# Patient Record
Sex: Female | Born: 1955 | Race: White | Hispanic: No | Smoking: Never smoker
Health system: Southern US, Community
[De-identification: ages and names within clinical notes are randomized; demographics above are authoritative.]

## PROBLEM LIST (undated history)

## (undated) DIAGNOSIS — F32A Depression, unspecified: Secondary | ICD-10-CM

## (undated) DIAGNOSIS — F329 Major depressive disorder, single episode, unspecified: Secondary | ICD-10-CM

## (undated) DIAGNOSIS — E119 Type 2 diabetes mellitus without complications: Secondary | ICD-10-CM

## (undated) DIAGNOSIS — F419 Anxiety disorder, unspecified: Secondary | ICD-10-CM

## (undated) DIAGNOSIS — J45909 Unspecified asthma, uncomplicated: Secondary | ICD-10-CM

## (undated) DIAGNOSIS — I10 Essential (primary) hypertension: Secondary | ICD-10-CM

## (undated) DIAGNOSIS — E039 Hypothyroidism, unspecified: Secondary | ICD-10-CM

## (undated) HISTORY — PX: TONSILLECTOMY: SUR1361

## (undated) HISTORY — PX: DILATION AND CURETTAGE OF UTERUS: SHX78

## (undated) HISTORY — PX: SINUS ENDO W/FUSION: SHX777

## (undated) HISTORY — PX: ABDOMINAL HYSTERECTOMY: SHX81

## (undated) HISTORY — PX: EYE SURGERY: SHX253

---

## 2005-06-28 ENCOUNTER — Emergency Department (HOSPITAL_COMMUNITY): Admission: EM | Admit: 2005-06-28 | Discharge: 2005-06-28 | Payer: Self-pay | Admitting: Family Medicine

## 2005-09-25 ENCOUNTER — Ambulatory Visit: Payer: Self-pay | Admitting: Internal Medicine

## 2006-02-26 ENCOUNTER — Ambulatory Visit: Payer: Self-pay | Admitting: Pulmonary Disease

## 2006-08-28 ENCOUNTER — Ambulatory Visit (HOSPITAL_COMMUNITY): Admission: RE | Admit: 2006-08-28 | Discharge: 2006-08-28 | Payer: Self-pay | Admitting: Family Medicine

## 2006-12-31 ENCOUNTER — Encounter (INDEPENDENT_AMBULATORY_CARE_PROVIDER_SITE_OTHER): Payer: Self-pay | Admitting: Radiology

## 2006-12-31 ENCOUNTER — Encounter: Admission: RE | Admit: 2006-12-31 | Discharge: 2006-12-31 | Payer: Self-pay | Admitting: Family Medicine

## 2007-08-12 ENCOUNTER — Encounter: Payer: Self-pay | Admitting: Internal Medicine

## 2008-01-20 ENCOUNTER — Ambulatory Visit (HOSPITAL_COMMUNITY): Admission: RE | Admit: 2008-01-20 | Discharge: 2008-01-20 | Payer: Self-pay | Admitting: Gastroenterology

## 2008-01-20 ENCOUNTER — Encounter (INDEPENDENT_AMBULATORY_CARE_PROVIDER_SITE_OTHER): Payer: Self-pay | Admitting: Gastroenterology

## 2008-02-18 ENCOUNTER — Ambulatory Visit (HOSPITAL_COMMUNITY): Admission: RE | Admit: 2008-02-18 | Discharge: 2008-02-18 | Payer: Self-pay | Admitting: Gastroenterology

## 2008-04-09 ENCOUNTER — Emergency Department (HOSPITAL_BASED_OUTPATIENT_CLINIC_OR_DEPARTMENT_OTHER): Admission: EM | Admit: 2008-04-09 | Discharge: 2008-04-09 | Payer: Self-pay | Admitting: Emergency Medicine

## 2008-05-30 ENCOUNTER — Encounter: Payer: Self-pay | Admitting: Emergency Medicine

## 2008-05-30 ENCOUNTER — Inpatient Hospital Stay (HOSPITAL_COMMUNITY): Admission: AD | Admit: 2008-05-30 | Discharge: 2008-06-02 | Payer: Self-pay | Admitting: Internal Medicine

## 2008-05-30 ENCOUNTER — Ambulatory Visit: Payer: Self-pay | Admitting: Diagnostic Radiology

## 2008-06-24 ENCOUNTER — Ambulatory Visit: Payer: Self-pay | Admitting: Internal Medicine

## 2008-06-24 ENCOUNTER — Inpatient Hospital Stay (HOSPITAL_COMMUNITY): Admission: EM | Admit: 2008-06-24 | Discharge: 2008-06-26 | Payer: Self-pay | Admitting: Emergency Medicine

## 2008-12-24 IMAGING — CR DG BE W/ AIR HIGH DENSITY
7 series · 7 of 7 positions shown · IV contrast (agent unspecified)
Comparison: None

CLINICAL DATA: Anemia.  Incomplete colonoscopy.

AIR-CONTRAST BARIUM ENEMA
TECHNIQUE: After obtaining a scout radiograph air-contrast barium
enema was performed under fluoroscopy using high-density barium.
Fluoroscopy Time: 2.6 minutes
Contrast: High density barium and air

[view not recorded (1 of 7)]
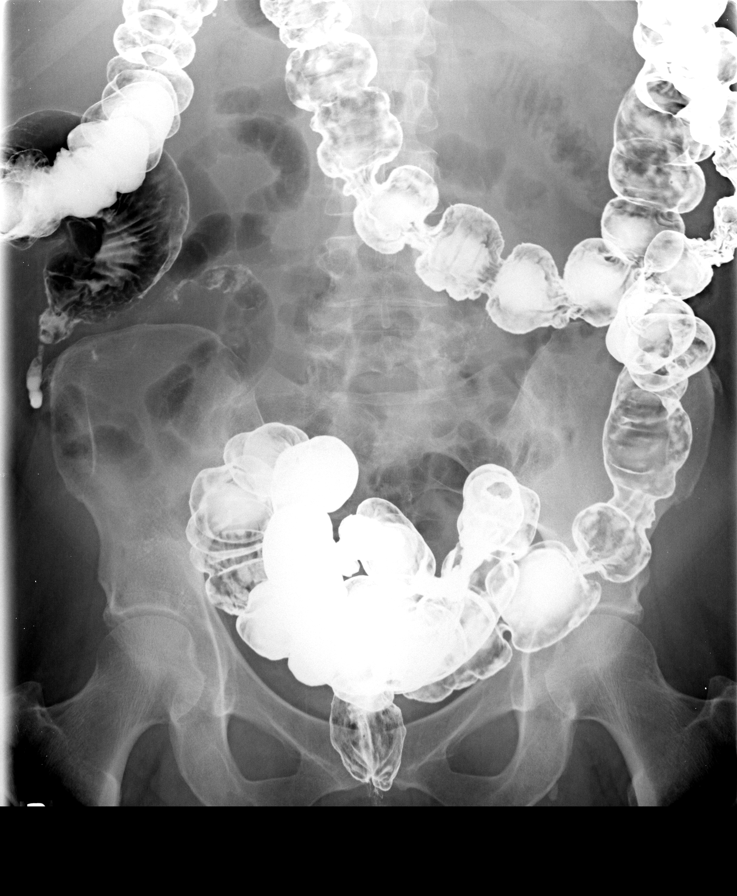

[view not recorded (2 of 7)]
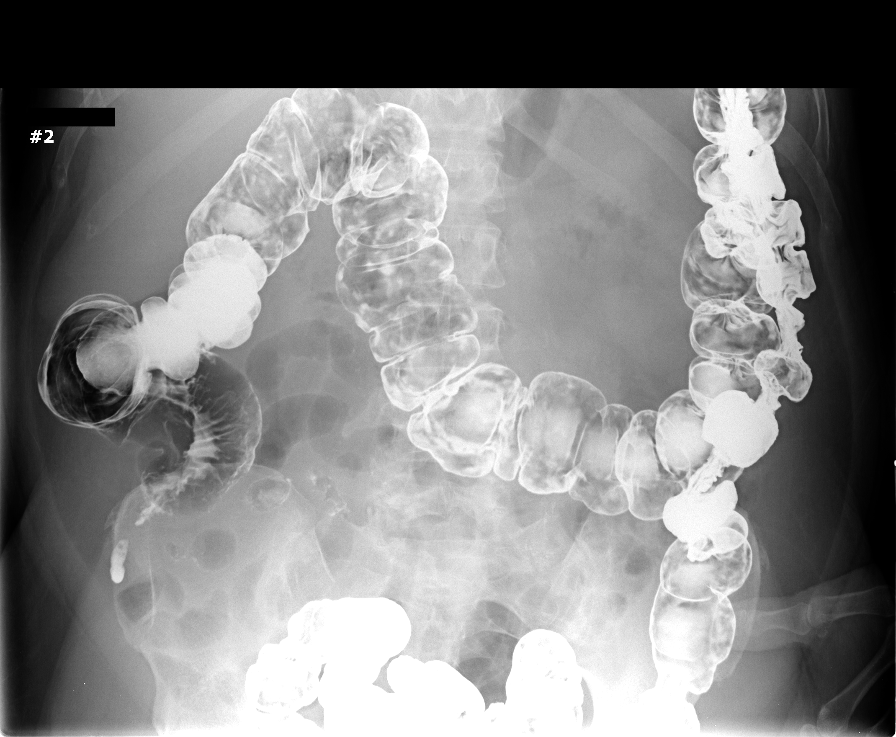

[view not recorded (3 of 7)]
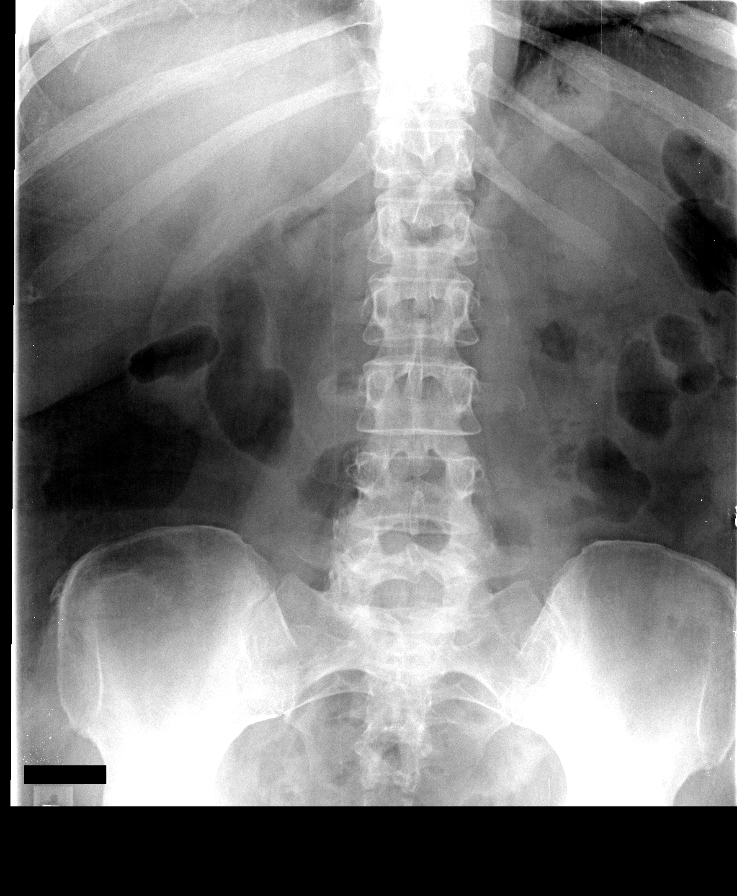

[view not recorded (4 of 7)]
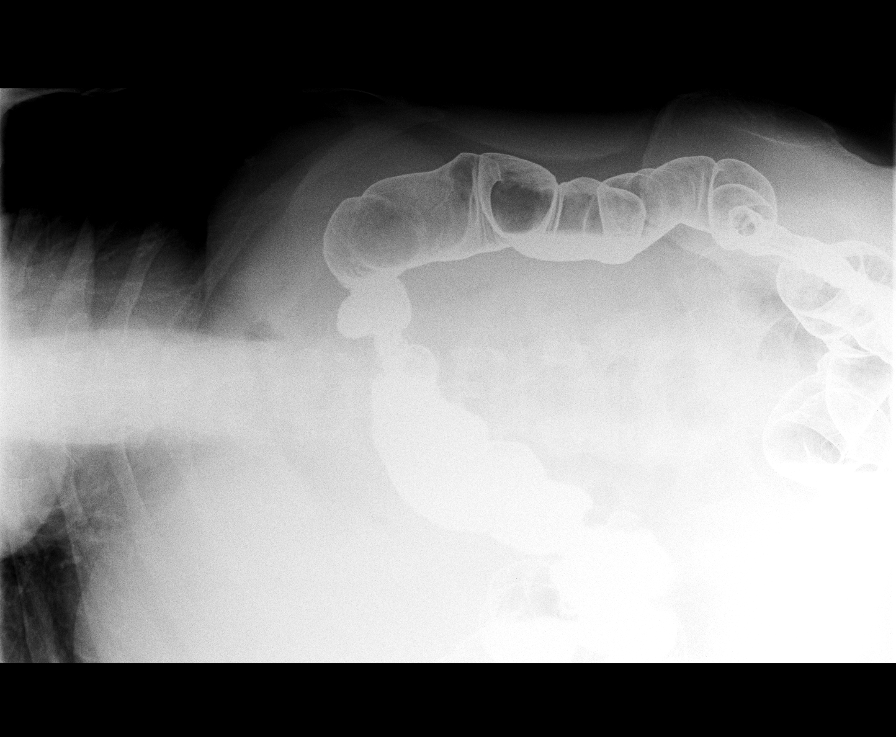

[view not recorded (5 of 7)]
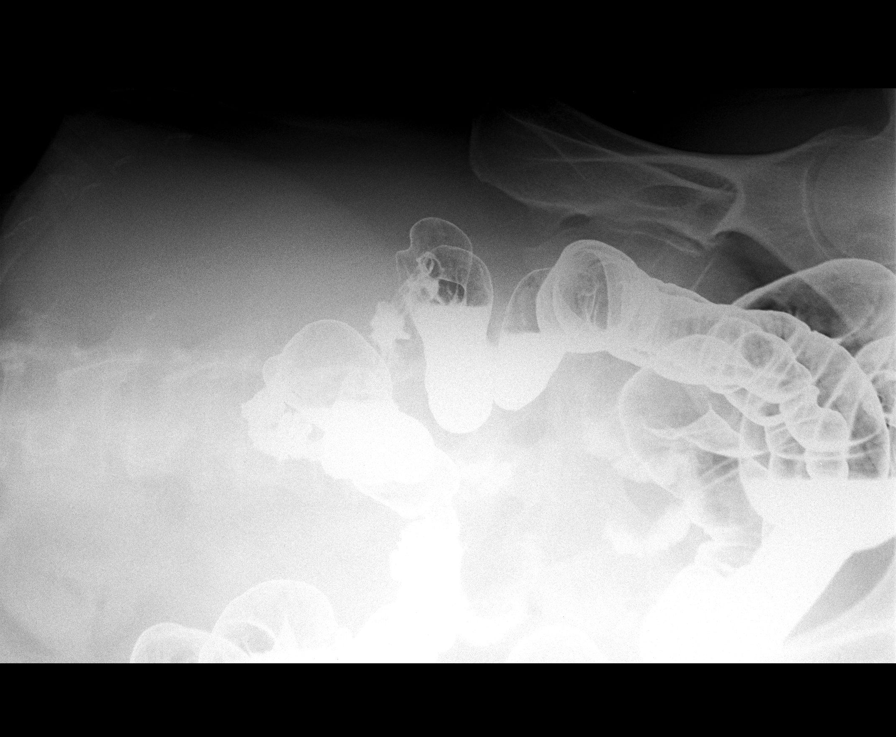

[view not recorded (6 of 7)]
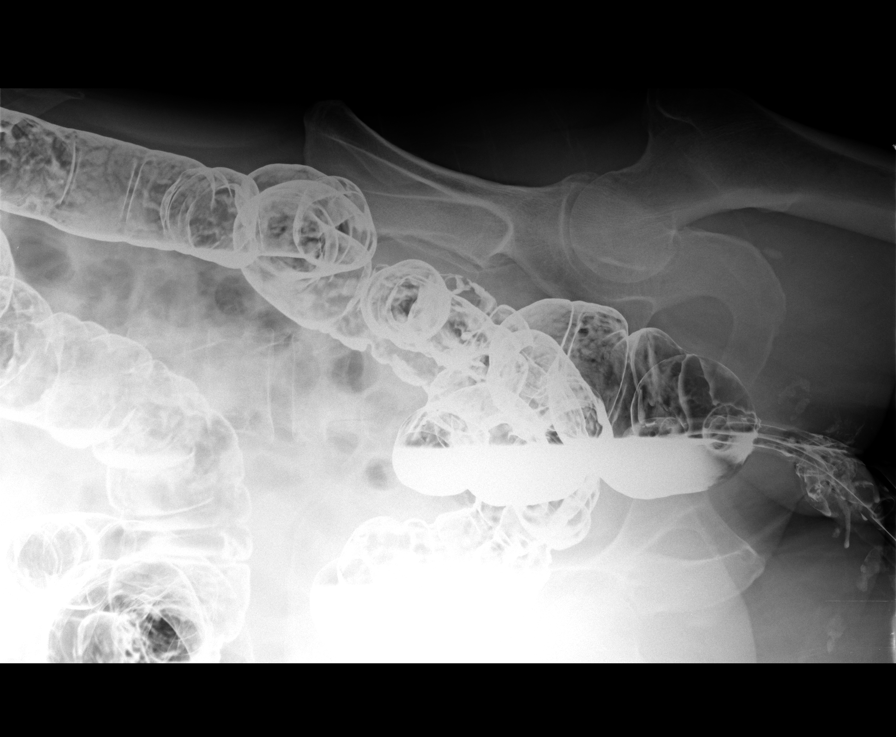

[view not recorded (7 of 7)]
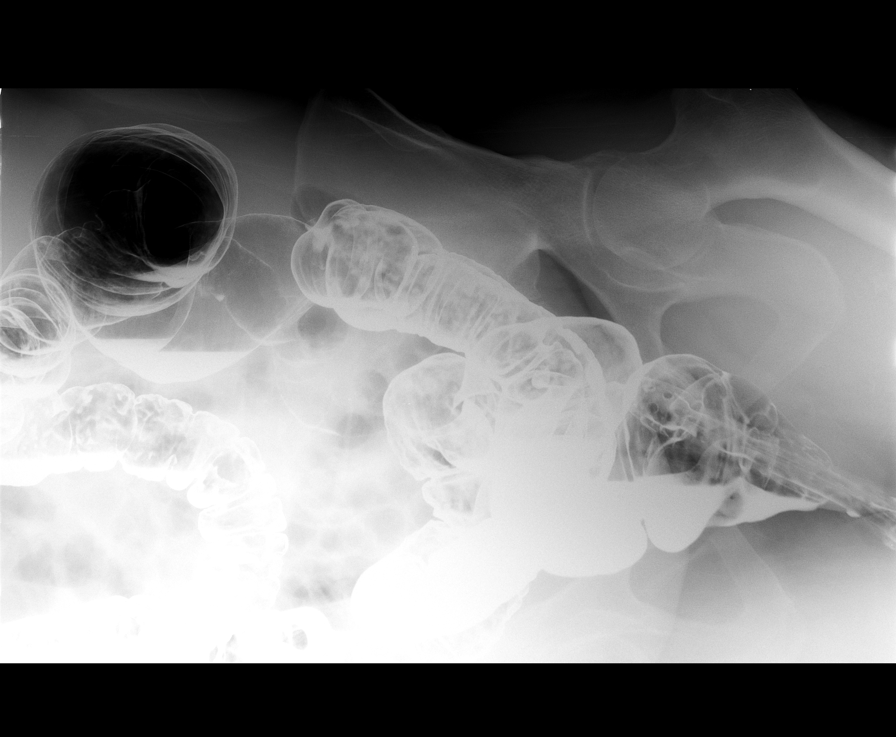

[7 of 7 positions shown; findings below may reference images not displayed]

FINDINGS: The entire colon was filled with contrast and air with
filling of a normal appearing appendix and reflux into a normal-
appearing terminal ileum.  Scattered diverticulosis coli is seen
without evidence of diverticulitis.  There were some areas of
intermittent colonic spasm.  Despite obtaining two sets of
decubitus images there is decompression of the colon into the
terminal ileum.  No mass, polyp, or changes of inflammatory disease
are seen.  No obstruction was evident.
IMPRESSION: Diverticulosis coli without evidence of diverticulitis.  Areas of
intermittent colonic spasm.  Otherwise normal appearance of the
colon.  Normal appearance of the appendix and terminal ileum.

## 2009-06-12 ENCOUNTER — Ambulatory Visit: Payer: Self-pay | Admitting: Diagnostic Radiology

## 2009-06-12 ENCOUNTER — Emergency Department (HOSPITAL_BASED_OUTPATIENT_CLINIC_OR_DEPARTMENT_OTHER): Admission: EM | Admit: 2009-06-12 | Discharge: 2009-06-12 | Payer: Self-pay | Admitting: Emergency Medicine

## 2009-10-25 ENCOUNTER — Encounter: Admission: RE | Admit: 2009-10-25 | Discharge: 2009-10-25 | Payer: Self-pay | Admitting: Family Medicine

## 2010-09-24 LAB — CBC
HCT: 33.9 % — ABNORMAL LOW (ref 36.0–46.0)
MCHC: 32.1 g/dL (ref 30.0–36.0)
MCV: 80.8 fL (ref 78.0–100.0)
Platelets: 262 10*3/uL (ref 150–400)
RDW: 17.4 % — ABNORMAL HIGH (ref 11.5–15.5)

## 2010-09-24 LAB — GLUCOSE, CAPILLARY
Glucose-Capillary: 213 mg/dL — ABNORMAL HIGH (ref 70–99)
Glucose-Capillary: 259 mg/dL — ABNORMAL HIGH (ref 70–99)
Glucose-Capillary: 276 mg/dL — ABNORMAL HIGH (ref 70–99)
Glucose-Capillary: 280 mg/dL — ABNORMAL HIGH (ref 70–99)

## 2010-09-24 LAB — BASIC METABOLIC PANEL
CO2: 27 mEq/L (ref 19–32)
Glucose, Bld: 200 mg/dL — ABNORMAL HIGH (ref 70–99)
Potassium: 3.8 mEq/L (ref 3.5–5.1)
Sodium: 139 mEq/L (ref 135–145)

## 2010-09-24 LAB — CULTURE, RESPIRATORY W GRAM STAIN

## 2010-09-24 LAB — POCT I-STAT, CHEM 8
BUN: 13 mg/dL (ref 6–23)
Calcium, Ion: 1.17 mmol/L (ref 1.12–1.32)
Chloride: 103 mEq/L (ref 96–112)
HCT: 36 % (ref 36.0–46.0)
Sodium: 140 mEq/L (ref 135–145)

## 2010-09-24 LAB — EXPECTORATED SPUTUM ASSESSMENT W GRAM STAIN, RFLX TO RESP C

## 2010-09-24 LAB — DIFFERENTIAL
Eosinophils Absolute: 0.7 10*3/uL (ref 0.0–0.7)
Eosinophils Relative: 8 % — ABNORMAL HIGH (ref 0–5)
Lymphs Abs: 2.9 10*3/uL (ref 0.7–4.0)
Monocytes Absolute: 0.8 10*3/uL (ref 0.1–1.0)

## 2010-09-24 LAB — HEMOGLOBIN A1C: Hgb A1c MFr Bld: 6.9 % — ABNORMAL HIGH (ref 4.6–6.1)

## 2010-10-23 NOTE — Discharge Summary (Signed)
NAMEELEONORE, SHIPPEE             ACCOUNT NO.:  [id number]   MEDICAL RECORD NO.:  [id number]          PATIENT TYPE:  INP   LOCATION:  1517                         FACILITY:  [facility]   PHYSICIAN:  Corinna L. Lendell Caprice, MDDATE OF BIRTH:  09-21-1955   DATE OF ADMISSION:  06/24/2008  DATE OF DISCHARGE:  06/26/2008                               DISCHARGE SUMMARY   DISCHARGE DIAGNOSES:  1. Asthma exacerbation.  2. Probable viral upper respiratory infection.  3. Chronic sinusitis.  4. Fibromyalgia.  5. History of anxiety and depression.  6. History of deep vein thrombosis.  7. Hypertension.   DISCHARGE MEDICATIONS:  Prednisone taper for 9 days, guaifenesin liquid  10 mL p.o. q.4h. p.r.n. cough. She may continue the rest of her  outpatient medications, which include Klonopin 1 mg twice a day, Xopenex  nebulizer as needed, Pulmicort nebulizer with Perforomist twice a day,  Diovan HCT 160/25 mg daily, hold for low blood pressure. Zocor 80 mg a  day, oxygen 2 liters nightly, calcium with vitamin D daily, fish oil  capsules, Lexapro 20 mg nightly, metformin 500 mg twice a day, Asmanex 2  puffs twice a day, Flonase 2 sprays daily.  Venlafaxine 134 mg a day.   Diet should be diabetic with adequate fluid intake.   Follow up with Dr. Sigmund Hazel in 1-2 weeks.  Follow up with  pulmonologist. Follow up with ENT.   CONDITION:  Stable.   CONSULTATIONS:  None.   PROCEDURES:  None.   LABS:  Sputum culture collected, results pending.  CBC showed normal a  white count on admission and hemoglobin 12.2, hematocrit 36.  Basic  metabolic panel significant for a glucose of 136 otherwise unremarkable.   SPECIAL STUDIES/RADIOLOGY:  Chest x-ray showed clear lungs without  infiltrate or effusion.   HISTORY AND HOSPITAL COURSE:  Ms. Kusch is a 55 year old white female  known to me from previous hospitalization who was admitted with asthma  exacerbation.  She was seen by Dr. Para March in the office for  worsening  shortness of breath.  She gives a history of fevers to 101 and relative  low blood pressure.  She has had no fevers here in the hospital, nor has  she had any hypotension.  Her oxygen saturations have been normal.  She  had recently been tapered off a course of prednisone and described an  increasing cough and congestion.  She has chronic sinusitis and was  concerned that her sinuses may be worsening.  Please see H and P for  further admission details.  She reportedly had no wheezing  when Dr.  Amador Cunas saw the patient.  During the hospitalization she has had  pseudo wheeze, but no true wheeze.  She has had paroxysms of cough which  have been productive..  She had  multiple questions and concerns and voiced a lot of stress at home.  She  also was concerned about sepsis but has shown no signs of such while  here. At this point she has normal vital signs, is feeling better, able  to ambulate and is stable for discharge.  Total time on  the day of  discharge was 40 minutes.      Corinna L. Lendell Caprice, MD  Electronically Signed     CLS/MEDQ  D:  06/26/2008  T:  06/26/2008  Job:  [id number]   cc:   Sigmund Hazel, M.D.  Fax: [phone]

## 2010-10-23 NOTE — Op Note (Signed)
NAME:  [name], STANBROUGH NO.:  [id number]   MEDICAL RECORD NO.:  [id number]          PATIENT TYPE:  AMB   LOCATION:  ENDO                         FACILITY:  [facility]   PHYSICIAN:  Danise Edge, M.D.   DATE OF BIRTH:  March 13, 1956   DATE OF PROCEDURE:  01/20/2008  DATE OF DISCHARGE:                               OPERATIVE REPORT   REFERRING PHYSICIAN:  Sigmund Hazel, MD   HISTORY:  Ms. [name] is a 55 year old female born on September 13, 1955.  [name] has unexplained iron deficiency anemia based on a  hemoglobin 11.3 g and serum ferritin 5.1 ng/mL.  She has undergone a  total abdominal hysterectomy in the past.   [name] tells me she was diagnosed with iron deficiency anemia  approximately 10 years ago while living in [place].  She tells me  her esophagogastroduodenoscopy and colonoscopy were normal.   MEDICATION ALLERGIES:  PENICILLIN and SULFA.   CHRONIC MEDICATIONS:  Zyflo CR, Lexapro, Zocor, Diovan, Klonopin,  prednisone, Pulmicort, fenofibrate, and Xopenex.   PAST MEDICAL - SURGICAL HISTORY:  Nasal polyps, asthma, anxiety,  hyperlipidemia, hypertension, history of deep venous thrombosis, and  hysterectomy.   ENDOSCOPIST:  Danise Edge, MD   PREMEDICATION:  Versed 12.5 mg and fentanyl 150 mcg.   PROCEDURE:  Esophagogastroduodenoscopy with small bowel biopsies.  After  obtaining informed consent, [name] was placed in the left lateral  decubitus position.  I administered intravenous fentanyl and intravenous  Versed to achieve conscious sedation for the procedure.  The patient's  blood pressure, oxygen saturation, and cardiac rhythm were monitored  throughout the procedure and documented in the medical record.   The Pentax gastroscope was passed through the posterior hypopharynx into  the proximal esophagus without difficulty.  The hypopharynx, larynx, and  vocal cords appeared normal.   ESOPHAGOSCOPY:  The proximal mid and lower segments  of the esophageal  mucosa appeared completely normal.   GASTROSCOPY:  Retroflex view of the gastric cardia and fundus was  normal.  The gastric body, antrum, and pylorus appeared completely  normal.   DUODENOSCOPY:  The duodenal bulb and descending duodenal mucosa appeared  completely normal.  Biopsies were obtained from the second portion of  the duodenum and distal duodenal bulb to rule out celiac disease.   ASSESSMENT:  Normal esophagogastroduodenoscopy.  Small bowel biopsies to  rule out celiac disease, pending.   PROCEDURE:  Incomplete proctocolonoscopy to the hepatic flexure.  Due to  colonic loop formation and prior pelvic surgery, I was unable to examine  the right colon and cecum.   Anal inspection and digital rectal exam were normal.  The Pentax  pediatric colonoscope was introduced into the rectum, and with  difficulty due to colonic loop formation and pelvic adhesions from  previous surgery, I eventually passed the colonoscope to the hepatic  flexure.  Despite repositioning the patient from the left lateral  decubitus position to the supine position and right lateral decubitus  position applying external abdominal pressure, I was never able to round  the hepatic flexure and enter the ascending colon.  Colonic preparation  for the exam today was good.   Rectum normal.  Sigmoid colon and descending colon normal.  Splenic flexure normal.  Transverse colon normal.  Hepatic flexure normal.  Cecum and ascending colon were not examined.   ASSESSMENT:  Normal proctocolonoscopy to the hepatic flexure.  Unable to  examine the ascending colon and cecum.   RECOMMENDATIONS:  I will schedule [name] for an air-contrast barium  enema to evaluate her right colon.           ______________________________  Danise Edge, M.D.     MJ/MEDQ  D:  01/20/2008  T:  01/20/2008  Job:  [id number]   cc:   Sigmund Hazel, M.D.

## 2010-10-23 NOTE — Discharge Summary (Signed)
NAME:  [name], [name] NO.:  [id number]   MEDICAL RECORD NO.:  [id number]          PATIENT TYPE:  INP   LOCATION:  3701                         FACILITY:  [facility]   PHYSICIAN:  Teresa Harvest, MD    DATE OF BIRTH:  11-Feb-1956   DATE OF ADMISSION:  05/30/2008  DATE OF DISCHARGE:  06/02/2008                               DISCHARGE SUMMARY   PRIMARY CARE PHYSICIAN:  Dr. Sigmund Haas of [facility].   DISCHARGE DIAGNOSES:  1. Acute on chronic asthma exacerbation, felt to be secondary to a      recent upper respiratory infection and chronic sinusitis.  2. 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  3. Hypokalemia.  4. Chronic sinusitis.  5. Depression.  6. History of deep vein thrombosis.  7. Hyperlipidemia.  8. Anxiety.  9. Reported history of fibromyalgia.   DISCHARGE MEDICATIONS:  1. Prednisone taper 60 mg p.o. b.i.d. x3 days then 60 mg p.o. daily x3      days, then 40 mg p.o. daily x3 days, then 20 mg p.o. daily x3 days,      then 10 mg p.o. daily x3 days.  2. Glucophage 500 mg p.o. b.i.d.  3. Lexapro 20 mg p.o. daily.  4. Mucinex 600 mg p.o. b.i.d. x5 days.  5. Use of Xopenex inhaler q.4 hours for the next 5 days, then as      needed.  6. Clonazepam 1 mg p.o. t.i.d.  7. Zocor 80 mg p.o. daily.  8. EpiPen as needed.  9. Diovan hydrochlorothiazide 160/25 mg p.o. daily.  10.Pulmicort nebulizer b.i.d.  11.Nasonex 2 sprays per nostril daily.  12.Perforomist b.i.d.  13.Asmanex 2 puffs b.i.d.  14.Fenofibrate 500 mg p.o. daily.  15.Home O2 as previously used.   DISPOSITION AND FOLLOWUP:  The patient will be discharged home.  The  patient is to schedule followup appointment with PCP in 1 week to follow  up the patient's asthma status, needs to be reassessed.  The patient is  on a steroid taper and this needs to be assessed as well.  The patient's  Lexapro was increased to 20 mg during the hospitalization and she will  remain on that.  The patient was also  started on Glucophage this  hospitalization at 500 mg p.o. b.i.d. for steroid-induced type 2  diabetes.  Hemoglobin A1c during hospitalization was 6.9.  The patient's  diabetes will need to be reassessed by PCP.  The patient will also need  a basic metabolic profile to follow up on electrolytes and renal  function.  A CBC will need to be checked to follow up on the patient's  leukocytosis.   CONSULTATIONS:  None.   PROCEDURES PERFORMED:  CT angiogram of the chest was done on May 30, 2008 that showed no evidence of acute pulmonary embolism, bilateral  central peribronchial thickening, and mild atelectasis versus scarring  in the right middle lobe.  These findings were nonspecific and may be  seen with asthma or bronchitis.  Shotty mediastinal lymph nodes which  are nonspecific, but most likely reactive in etiology.  No  pathologically enlarged nodes or other  soft tissue masses identified.  Chest x-ray obtained June 01, 2008 showed a small pleural effusion  versus pleural thickening without active airspace disease or heart  failure, essentially a normal chest x-ray otherwise.   BRIEF ADMISSION HISTORY AND PHYSICAL:  [name] is a 55 year old female  white female with asthma who had been transferred from the [facility] emergency room to the stepdown unit with an asthma  exacerbation.  She was seen by [facility] in the past and is  currently being followed by someone outside the facility.  The patient  reported she has chronic moderate to severe persistent asthma.  She has  been sick for several weeks.  She initially had flu-like illnesses with  myalgias, fevers, cough, rhinorrhea, sore throat, and was treated for  presumed H1N1 influenza with Tamiflu.  The patient completed this  course.  The patient had been on a course of Avelox and recently has  taken Biaxin for chronic sinusitis.  The patient's flu-like symptoms  have resolved, and she continued to  have a nonproductive cough,  shortness of breath, particularly with exertion, and has been using home  nebulizers without relief.  She tried to make an appointment with her  PCP, but apparently became too short of breath and went to the emergency  room.  She reportedly had a respiratory rate initially in the 40s and  had difficulty talking secondary to her asthma.  She currently felt  better per admitting physician, after receiving an hour-long continuous  nebulizer and IV Solu-Medrol.  The patient reportedly had been intubated  in the past.  The patient denied any fevers or chills.  Denied any  change in chronic pain from myalgias.  Appetite has been good and she  has no vomiting or diarrhea, and she has no rash.   PHYSICAL EXAM:  VITAL SIGNS:  Per admitting physician, temperature 97.9,  blood pressure 138/76, pulse 111.  Respiratory rate 18.  Apparently was  in the 40s in the ED.  Satting low 90s on room air, currently 99% on 2L  nasal cannula.  GENERAL:  The patient was in no acute distress.  The patient was able to  speak in full sentences, lying supine.  HEENT:  Normocephalic, atraumatic.  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  Slightly dry mucous membranes.  NECK:  Supple.  No lymphadenopathy.  No JVD.  LUNGS:  Good air movement with mild bilateral wheezing and pseudo-  wheeze.  No rales or rhonchi.  CARDIOVASCULAR:  Tachycardic.  Regular.  No murmurs, rubs, or gallops.  ABDOMEN:  Soft and nontender, nondistended.  Positive bowel sounds.  GENITOURINARY:  Deferred.  RECTAL:  Deferred.  EXTREMITIES:  No cyanosis, clubbing, or edema.  NEUROLOGIC:  The patient was alert and oriented.  Cranial nerves and  sensorimotor exam were intact.  PSYCH:  Normal affect.  SKIN:  No rash.  No jaundice.   ABG on room air had a pH of 7.42, PCO2 42, PO2 62, bicarb 29, base  excess 3, O2 saturation 92%.  CBC with a white count of 10.8 with 20%  eosinophils.  Basic metabolic panel significant for  a potassium of 3.3,  glucose 218, otherwise unremarkable.  Point of care cardiac enzymes were  negative.  CT angiogram of the chest, as stated above, was negative for  PE.  Results as stated above.   HOSPITAL COURSE:  1. Acute on chronic asthma exacerbation.  The patient was admitted      with acute  on chronic asthma exacerbation.  It was felt this was      secondary to a recent respiratory infection and chronic sinusitis.      The patient was initially admitted to the stepdown unit.  The      patient was placed on IV Solu-Medrol as well as Pulmicort and      nebulizer treatments with Xopenex.  The patient's Biaxin was      continued as well.  Chest x-ray was obtained with results as stated      above.  Point of care cardiac markers also obtained were negative.      The patient improved clinically and symptomatically where she could      talk without being short of breath.  The patient was maintained on      O2 and neb.  She was also placed on Tessalon Perles for her cough      as well.  The patient had an improvement with her wheezing and her      shortness of breath.  The patient's IV steroids were then tapered      to oral steroids, which the patient will complete a slow taper at      home.  The patient will also be discharged home on Xopenex to use      every 4 hours for the next 5 days then on an as needed basis.  The      patient will also be discharged home with some Mucinex.  The      patient was maintained on Biaxin and will no longer need any more      antibiotic treatment after today's dose.  The patient will be      discharged home in stable and improved condition to follow up with      PCP in 1 week.  2. Type 2 diabetes.  Felt to be secondary to steroids.  The patient's      hemoglobin A1c was checked, which came back at 6.9.  The patient,      during the hospitalization, was started on Glucophage 500 mg daily,      as well as Lantus on sliding scale insulin.  The patient's  CBG was      monitored.  The patient will be discharged home on Glucophage 500      mg p.o. b.i.d. with further followup with her PCP.  3. Hypokalemia.  The patient was noted to be hypokalemic during the      hospitalization.  This was felt secondary to nebulizer treatment.      The patient's potassium was repleted and the patient will be      discharged in stable condition.  4. Chronic sinusitis.  Stable.  The patient was maintained on her home      dose of Biaxin during the rest of the hospitalization.  5. Depression.  Stable.  The patient's Lexapro was increased to 20 mg      daily.  The patient will be discharged home on this and the patient      will need further followup as an outpatient.  6. Leukocytosis.  The patient was noted to have leukocytosis during      the hospitalization.  Chest x-ray obtained was negative.  The      patient was asymptomatic, did not have any urinary symptoms.  It      was thought the patient's leukocytosis was likely secondary to      steroid-induced.  The  patient's white count improved during the      hospitalization.  The patient will need a repeat CBC done on      followup with PCP.   The rest of the patient's chronic medical issues were stable throughout  the hospitalization, and the patient will be discharged in stable and  improved condition.   VITAL SIGNS:  On day of discharge, temperature 98.7, pulse 91, blood  pressure 143/88, respiratory rate 18, satting 96% on 2L by nasal  cannula.   DISCHARGE LABS:  CBC, white count 12.8, hemoglobin 10.3, hematocrit  31.8, platelets of 361,000.  ANC 11.4.  Sodium 142, potassium 3.7,  chloride 108, bicarb 26, BUN 23, creatinine 0.99, glucose 247, and  calcium of 9.1.   It was a pleasure taking care of Ms. Kennyth Arnold.      Teresa Harvest, MD  Electronically Signed     DT/MEDQ  D:  06/02/2008  T:  06/02/2008  Job:  [id number]   cc:   [name], M.D.

## 2010-10-23 NOTE — H&P (Signed)
NAME:  [name], [name] NO.:  [id number]   MEDICAL RECORD NO.:  [id number]          PATIENT TYPE:  INP   LOCATION:  1517                         FACILITY[facility]   PHYSICIAN:  Gordy Savers, MDDATE OF BIRTH:  05-08-1956   DATE OF ADMISSION:  06/24/2008  DATE OF DISCHARGE:                              HISTORY & PHYSICAL   CHIEF COMPLAINT:  Shortness of breath.   HISTORY OF PRESENT ILLNESS:  The patient is a 55 year old female with a  20 year history of chronic asthma.  She was hospitalized last month and  was discharged on a 15 day regimen of prednisone.  She completed this  taper approximately 10 days ago.  For the past several days, she has had  increasing cough, congestion and worsening wheezing.  She states that  she has had  intermittent fever to 101 degrees and scanty sputum  production.  At times, she expectorates small-volume yellowish sputum.  The patient was evaluated by her primary care Anairis Knick on the day of  admission and referred to the ER for further assessment. Evaluation  revealed a normal white count of 8.4.  Chest x-ray revealed no  cardiopulmonary disease.  Chemistries were unremarkable.   The patient also gave a history of periodic hypotension with episodes of  tachycardia.  The patient's daughters had been monitoring her condition  with a home blood pressure monitor.  Due to her refractory shortness of  breath, she presented to the ED for evaluation.  She is now admitted for  evaluation and treatment of decompensated asthma.   PAST MEDICAL HISTORY:  1. The patient has a 20 year history of asthma.  2. Other medical problems include recent diagnosis of type 2 diabetes      associated with steroid use.  3. Fibromyalgia.  4. History of anxiety and depression.  5. She has a history of deep vein thrombosis.  6. She is on medication for dyslipidemia and also hypertension.  7. She has chronic sinusitis.  8. History of nasal polyposis.   PAST SURGICAL HISTORY:  Prior hysterectomy.   DRUG ALLERGIES:  1. BIAXIN.  2. PENICILLIN.  3. SULFA MEDICATIONS.   CURRENT MEDICATIONS:  1. Xopenex and Pulmicort nebulizer treatments.  2. Diovan 160/25 daily.  3. Klonopin 1 mg b.i.d.  4. Simvastatin 80 mg daily.  5. Fenofibrate, unclear dose.  6. Lexapro 20 mg daily.  7. Nasacort, 2 inhalations to each nares daily.  8. Asmanex 2 inhalations b.i.d.  9. Metformin 500 mg b.i.d.   SOCIAL HISTORY:  The patient is divorced.  Lifelong nonsmoker.   FAMILY HISTORY:  Negative for asthma and noncontributory.  Please see  admission history and physical of December 2009 for further details.   PHYSICAL EXAMINATION:  GENERAL:  Revealed a moderately overweight white  female in no acute distress at rest. VITAL SIGNS:  Temperature of 98.6,  O2 saturation 97%, pulse rate 100, respiratory rate 16-20.  SKIN:  Warm and dry without rash.  HEAD/NECK:  Revealed normal pupil responses.  Conjunctivae are clear.  Tympanic membranes were both normal.  Oropharynx was mildly injected.  There is  some pharyngeal crowding with low-hanging soft palate.  Neck  revealed no adenopathy.  There are no bruits or neck vein distention.  CHEST:  Revealed a slightly prolonged expiratory phase.  Scattered  rhonchi noted, but no active wheezing.  Frequent paroxysms of coughing  noted during the exam.  CARDIOVASCULAR:  Revealed a regular tachycardia at the rate of 100.  No  murmur is noted.  ABDOMEN:  Obese, soft and nontender.  No organomegaly.  EXTREMITIES:  Revealed no edema, clubbing or cyanosis.  Pedal pulses  were full.   IMPRESSION:  1. Asthma exacerbation probably precipitated by recent viral upper      respiratory infection in the setting of recent steroid taper.  2. Additional diagnoses of hypertension.  3. Fibromyalgia.  4. Chronic anxiety/depression.  5. Type 2 diabetes.  6. Dyslipidemia.  7. History of chronic sinusitis and nasal polyposis.  8.  Hypertension.   DISPOSITION:  The patient will be admitted to the hospital.  She has  received an initial parenteral dose of Solu-Medrol.  This will be  continued.  The patient will be treated with aggressive inhalational  bronchodilators.  She will be treated symptomatically with expectorants  and antitussives.  She will be maintained on DVT prophylaxis and her  preadmission medications for hypertension and dyslipidemia.      Gordy Savers, MD  Electronically Signed     PFK/MEDQ  D:  06/24/2008  T:  06/25/2008  Job:  [id number]

## 2010-10-23 NOTE — H&P (Signed)
[name], [name]             ACCOUNT NO.:  [id number]   MEDICAL RECORD NO.:  [id number]          PATIENT TYPE:  INP   LOCATION:  3701                         FACILITY:  [facility]   PHYSICIAN:  Corinna L. Lendell Caprice, MDDATE OF BIRTH:  [birth year]   DATE OF ADMISSION:  05/30/2008  DATE OF DISCHARGE:                              HISTORY & PHYSICAL   CHIEF COMPLAINT:  Shortness of breath.   HISTORY OF PRESENT ILLNESS:  [name] is a 55 year old white female  with asthma who has been transferred from the [facility]  Emergency Room to the step-down unit with an asthma exacerbation.  She  has seen [facility] in the past but is currently followed by  someone at an outside facility.  She reportedly has chronic moderate-to-  severe persistent asthma.  She has been sick for several weeks.  She  initially had a flu-like illness with myalgias, fevers, cough,  rhinorrhea, sore throat, and was treated for presumed H1N1 influenza  with a Tamiflu.  She has completed this course.  She also has been on a  course of Avelox and currently taking Biaxin for chronic sinusitis.  Her  flu-like symptoms have resolved, but she continues to have a  nonproductive cough, shortness of breath particularly with exertion.  She has been using her home nebulizers without relief.  She tried to  make an appointment with Dr. Hyacinth Meeker, but apparently became too short of  breath and went to the emergency room.  She reportedly had a respiratory  rate initially in the 40s and had difficulty talking due to her asthma.  She currently feels better after receiving an hour long continuous  nebulizer and IV Solu-Medrol.  She reportedly has been intubated in the  past.  She denies any fevers, chills.  She denies any change in her  chronic pain from myalgia.  Her appetite has been good.  She has had no  vomiting or diarrhea.  She has no rash.   PAST MEDICAL HISTORY:  1. Asthma.  2. Chronic sinusitis.  3.  Hyperlipidemia.  4. Hypertension.  5. Anxiety.  6. Depression.  7. Previous history of DVT.  8. Reported fibromyalgia.   MEDICATIONS:  1. Pulmicort nebulizer b.i.d.  2. Diovan HCT 160/25 mg daily.  3. Klonopin 1 mg in the morning 2 mg at night.  4. Zocor 80 mg a day.  5. Fenofibrate micronized daily dose unknown.  6. Lexapro 10 mg a day.  7. Nasonex 2 sprays per nostril daily.  8. She has a few more days worth of Biaxin.  9. She is also on Asmanex Twisthaler, Xopenex handheld nebulizer as      needed.  10.Perforomist nebulizer b.i.d.   ALLERGIES:  She reports an allergy to PENICILLIN and SULFA.   SOCIAL HISTORY:  She does not smoke, drink, or use drugs.   FAMILY HISTORY:  Noncontributory.   REVIEW OF SYSTEMS:  CONSTITUTIONAL:  No fevers.  No weight loss or  weight gain.  HEENT:  As above.  She is awaiting sinus surgery.  RESPIRATORY:  As above.  CARDIOVASCULAR:  No chest pains or  palpitations.  GI:  As above.  GU:  No dysuria.  MUSCULOSKELETAL:  As  above.  SKIN:  No rash.  PSYCHIATRIC:  She reports that she has been  under a lot of stress recently and has been very tearful.  She reports  that she was going to talk to Dr. Hyacinth Meeker about increasing her Lexapro.  NEUROLOGIC:  No history of stroke or seizure.  ENDOCRINE:  Denies  history of diabetes.  HEMATOLOGIC:  As above.   PHYSICAL EXAMINATION:  VITAL SIGNS:  Temperature is 97.9, blood pressure  138/76, pulse 111.  Respiratory rate currently 18, but apparently was in  the 40s in the emergency room.  Oxygen saturation was in the low 90s on  room air, currently 99% on 2 L nasal cannula.  GENERAL:  The patient is in no acute distress.  She is able to speak in  full sentences.  She is lying supine.  HEENT:  Normocephalic, atraumatic.  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  She has slightly dry mucous membranes.  NECK:  Supple.  No lymphadenopathy.  No JVD.  LUNGS:  She has good air movement and mild bilateral wheeze  and pseudo  wheeze.  No rales or rhonchi.  CARDIOVASCULAR:  Regular, rapid.  No murmurs, gallops, or rubs.  ABDOMEN:  Soft, nontender, and nondistended.  GU:  Deferred.  RECTAL:  Deferred.  EXTREMITIES:  No clubbing, cyanosis, or edema.  NEUROLOGIC:  Alert and oriented.  Cranial nerves and sensorimotor exam  were intact.  PSYCHIATRIC:  Normal affect.  SKIN:  No rash.  No jaundice.   LABORATORY DATA:  ABG, I believe, on room air showed a pH of 7.424, pCO2  of 42, pO2 of 62, bicarbonate was 29, base excess 3, oxygen saturation  92%.  White blood cell count is 10.8 with 20% eosinophils.  Basic  metabolic panel is significant for potassium of 3.38, glucose of 218,  otherwise unremarkable.  Point-of-care enzymes negative.   DIAGNOSTIC STUDIES:  EKG was done, but is not currently on the chart.  CT angiogram of the chest shows no acute pulmonary embolus, bilateral  central peribronchial thickening and mild atelectasis versus scarring in  the right middle lobe, shoddy mediastinal lymph nodes nonspecific,  likely reactive.   ASSESSMENT AND PLAN:  1. Asthma exacerbation secondary to recent upper respiratory infection      and chronic sinusitis:  The patient will remain in the step-down      unit.  She appears more comfortable than what was described in the      emergency room.  I will give IV Solu-Medrol, continue her Pulmicort      and inhaled steroids.  I will also give Xopenex and continue her      Biaxin.  2. Hypokalemia.  This will be repleted.  3. Hyperglycemia.  She will get sliding scale insulin.  I will check a      hemoglobin A1c.  4. Chronic sinusitis.  Continue outpatient medications.  5. Hyperlipidemia.  Continue outpatient medications.  6. Hypertension.  I will hold her hydrochlorothiazide as she appears      somewhat volume-depleted currently.  7. Anxiety.  Continue Klonopin.  8. Depression.  Increase Lexapro to 20 mg a day.  9. History of deep venous thrombosis several  years ago.  She will get      deep venous thrombosis prophylaxis.  10.Fibromyalgia.  11.Eosinophilia.  CBC with differential will be repeated and may need  further workup.      Corinna L. Lendell Caprice, MD  Electronically Signed     CLS/MEDQ  D:  05/31/2008  T:  06/01/2008  Job:  [id number]   cc:   Sigmund Hazel, M.D.

## 2010-10-26 NOTE — Assessment & Plan Note (Signed)
[facility]                               PULMONARY OFFICE NOTE   [name], [name]                      MRN:          [id number]  DATE:02/26/2006                            DOB:          11-Apr-1956    The patient is a 55 year old white female who has been apparently diagnosed  with asthma for approximately 18 years.  She has had several episodes of  intubations.  The patient has been seeing Dr. Sidney Ace for allergies  and has recently been referred to ENT for evaluation of nasal polyps.  She  saw my partner, Dr. Sandrea Hughs, in April 2007 for an initial evaluation.  She apparently was not satisfied with his assessment and has requested that  I assume her care.  I am, however, going to make an assessment and  evaluation but determination as to further care will have to be done pending  adherence to her regimen.   The patient states that she has had difficulties with asthma that has been  almost constant.  She has been on prednisone, on more than off for the  last 4-5 years.  She was last seen in [place] at [facility] and was  told by ENT that he would not touch her.  This was apparently five years  ago.  Since July 2006, she has not required admissions to the hospital but,  again, has had repeated care at outpatient centers for the same and has  required bursts of prednisone for the same.  During the last visit with Dr.  Sherene Sires, he astutely determined that the patient's major problem would be that  of upper airway instability and had recommended that she be off of ACE  inhibitors.  Also, it was recommended that the patient have potential  gastroesophageal reflux treated.   The patient presents today stating that she is on Avelox for what appears to  be acute on chronic sinusitis.  As stated above, she has problems with nasal  polyposis which is quite severe.  She did bring with her a diskette with her  most recent CT scan which was  done in July.  This confirms the patient's  severe pansinusitis and severe nasal polyposis.   CURRENT MEDICATIONS:  Singular 10 mg daily, Lexapro 10 mg daily, Klonopin 1  mg b.i.d., Diovan 160/25 one daily, Advair 500/50 one inhalation twice a  day, Zocor 20 mg daily, and Xopenex nebulizer q.i.d. p.r.n., she actually  uses this fairly frequently.  The patient currently denies being on  prednisone at this time.  She is also on Avelox 500 mg daily started as  recent as two days ago.   ALLERGIES:  The patient gives no history of allergies to any medications,  but she has significant sensitivities to dust mites, house dust, cats, dogs,  mold, and no known sensitivity to foods.   SOCIAL HISTORY:  She denies smoking.  She is currently a Training and development officer for  [employer].  The remainder of her history data is as noted on Dr.  Casimiro Needle Wert's write up in April 2007.   As  far as the patient's asthma, on a historical perspective, she was  apparently first diagnosed while living in [place] in 1989 and she  stated that during that time she lived in the hospital with an IV because  of her asthma.   PHYSICAL EXAMINATION:  GENERAL:  The patient is an ambulatory obese female  who has constant throat clearing and hoarse voice.  VITAL SIGNS:  Weight 178 pounds, oxygen saturation 97% on room air, the  remainder of her vital signs are as noted.  HEENT:  She has eccentric pupils bilaterally, she has mild turbinate edema  with crusting and clear nasal discharge.  Oropharynx reveals the patient has  a class III airway.  Ear canals are clear.  NECK:  Supple.  No adenopathy noted.  No JVD.  Trachea is midline.  No  thyromegaly.  LUNGS:  Entirely clear to auscultation bilaterally with great air movement.  HEART:  Regular rate and rhythm, no rubs, murmurs, or gallops noted.  ABDOMEN:  Obese.  EXTREMITIES:  Warm, no tenderness noted, no cyanosis, no clubbing, no edema.   We did perform a spirometry  today which was entirely normal, no suggestion  of obstruction despite the fact that she felt that her asthma was acting  up.  Her FEF 25/75 was at 64% indicating, perhaps, some mild airway  component, however, FEV1 and peak flows were entirely normal.  The patient  has suggestion of mild restrictive deficit likely because of obesity.   IMPRESSION:  1. The patient has history of chronic moderate to severe persistent      asthma, but I do believe that Dr. 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  Since the patient is to see ENT, I would like      to get the report of that visit, particularly with regards to      endoscopic examination of the upper airway.  2. I do believe the patient has some issues with laryngopharyngeal reflux      which may be occult.  She stated that she took Zegrid for approximately      one month but did not notice any exacerbation.  I did point out to her      that during the time she was taking this medication, she did not      require prednisone.  3. The patient also has severe nasal polyposis and chronic sinusitis.  The      main stay of her therapy, then, will be management of this situation.      Her disease is likely triggered, also, by sinus disease.   RECOMMENDATIONS:  1. The patient is to continue her Avelox treatment for the next ten days      as prescribed by Dr. [place] Callas.  Dr. [place] Callas has also proceeded with      testing for IGE to measure for potential use for Xolair and I do agree      with this intervention.  2. I would recommend that she give proton pump inhibitor a trial again,      however, she is reluctant to try this.  Since she is to see ENT, I      would like to have the report of endoscopic upper airway examination as     this will  help [place] determine whether reflux is, indeed, an  issue.  3. I will still advise her to refrain from the use of ACE inhibitors.  4. She needs probably to be switched from Advair Discus to a non-powder      form of delivery for her beta agonist and inhaled corticosteroids as      the Advair may be causing more irritation to the upper airway, however,      the patient is reluctant to do this at present.  5. I am not sure how helpful I will be able to be in this patient's care,      given that she has pre-conceived notions of what her care should be and      is not willing to accept new potential issues affecting her diagnosis.      She has been on excellent asthma therapy for which she has been non-      responsive and I suspect that either this is due to non-adherence to      therapy or due to the fact that, indeed, her main component is that of      vocal cord dysfunction and upper airway instability which is not      responsive to any of these medications.  6. Follow up will be in 4-6 weeks time.  I will re-evaluate the patient      after she sees the ENT physicians.  I will determine at that time if      further follow up here would be necessary, but it does seem to me that      her main issues are those of controlling allergen exposure and      management of her polyposis.                                   Gailen Shelter, MD   CLG/MedQ  DD:  02/26/2006  DT:  02/27/2006  Job #:  [id number]   cc:   Sidney Ace, M.D. [facility]  Sigmund Hazel, M.D.

## 2011-03-15 LAB — BASIC METABOLIC PANEL
BUN: 12 mg/dL (ref 6–23)
BUN: 15 mg/dL (ref 6–23)
BUN: 23 mg/dL (ref 6–23)
CO2: 25 mEq/L (ref 19–32)
CO2: 26 mEq/L (ref 19–32)
CO2: 29 mEq/L (ref 19–32)
Calcium: 9.1 mg/dL (ref 8.4–10.5)
Calcium: 9.3 mg/dL (ref 8.4–10.5)
Calcium: 9.8 mg/dL (ref 8.4–10.5)
Chloride: 103 mEq/L (ref 96–112)
Chloride: 107 mEq/L (ref 96–112)
Chloride: 108 mEq/L (ref 96–112)
Creatinine, Ser: 0.8 mg/dL (ref 0.4–1.2)
Creatinine, Ser: 0.88 mg/dL (ref 0.4–1.2)
Creatinine, Ser: 0.99 mg/dL (ref 0.4–1.2)
GFR calc Af Amer: >60 mL/min (ref >60)
GFR calc Af Amer: >60 mL/min (ref >60)
GFR calc Af Amer: >60 mL/min (ref >60)
GFR calc Af Amer: >60 mL/min (ref >60)
Glucose, Bld: 247 mg/dL — ABNORMAL HIGH (ref 70–99)
Potassium: 3.4 mEq/L — ABNORMAL LOW (ref 3.5–5.1)
Sodium: 143 mEq/L (ref 135–145)

## 2011-03-15 LAB — POCT I-STAT 3, ART BLOOD GAS (G3+)
Acid-Base Excess: 3 mmol/L — ABNORMAL HIGH (ref 0.0–2.0)
Bicarbonate: 27.8 mEq/L — ABNORMAL HIGH (ref 20.0–24.0)
Patient temperature: 97.9
pH, Arterial: 7.424 — ABNORMAL HIGH (ref 7.350–7.400)

## 2011-03-15 LAB — DIFFERENTIAL
Basophils Absolute: 0 10*3/uL (ref 0.0–0.1)
Basophils Relative: 0 % (ref 0–1)
Basophils Relative: 0 % (ref 0–1)
Basophils Relative: 0 % (ref 0–1)
Eosinophils Absolute: 0 10*3/uL (ref 0.0–0.7)
Eosinophils Absolute: 2.2 10*3/uL — ABNORMAL HIGH (ref 0.0–0.7)
Monocytes Relative: 3 % (ref 3–12)
Neutro Abs: 11.4 10*3/uL — ABNORMAL HIGH (ref 1.7–7.7)
Neutro Abs: 14 10*3/uL — ABNORMAL HIGH (ref 1.7–7.7)
Neutro Abs: 4.9 10*3/uL (ref 1.7–7.7)
Neutrophils Relative %: 45 % (ref 43–77)
Neutrophils Relative %: 89 % — ABNORMAL HIGH (ref 43–77)
Neutrophils Relative %: 89 % — ABNORMAL HIGH (ref 43–77)

## 2011-03-15 LAB — POCT CARDIAC MARKERS
CKMB, poc: 2 ng/mL (ref 1.0–8.0)
Myoglobin, poc: 76.9 ng/mL (ref 12–200)

## 2011-03-15 LAB — CBC
MCHC: 32.1 g/dL (ref 30.0–36.0)
MCHC: 32.5 g/dL (ref 30.0–36.0)
MCHC: 33.2 g/dL (ref 30.0–36.0)
MCV: 78.6 fL (ref 78.0–100.0)
MCV: 78.7 fL (ref 78.0–100.0)
Platelets: 342 10*3/uL (ref 150–400)
Platelets: 353 10*3/uL (ref 150–400)
Platelets: 361 10*3/uL (ref 150–400)
RBC: 3.92 MIL/uL (ref 3.87–5.11)
RBC: 4.05 MIL/uL (ref 3.87–5.11)
RBC: 4.6 MIL/uL (ref 3.87–5.11)
RDW: 15.9 % — ABNORMAL HIGH (ref 11.5–15.5)
WBC: 10.8 10*3/uL — ABNORMAL HIGH (ref 4.0–10.5)
WBC: 15.6 10*3/uL — ABNORMAL HIGH (ref 4.0–10.5)

## 2011-03-15 LAB — MAGNESIUM: Magnesium: 1.6 mg/dL (ref 1.5–2.5)

## 2011-03-15 LAB — GLUCOSE, CAPILLARY
Glucose-Capillary: 209 mg/dL — ABNORMAL HIGH (ref 70–99)
Glucose-Capillary: 221 mg/dL — ABNORMAL HIGH (ref 70–99)
Glucose-Capillary: 225 mg/dL — ABNORMAL HIGH (ref 70–99)
Glucose-Capillary: 250 mg/dL — ABNORMAL HIGH (ref 70–99)
Glucose-Capillary: 254 mg/dL — ABNORMAL HIGH (ref 70–99)
Glucose-Capillary: 255 mg/dL — ABNORMAL HIGH (ref 70–99)
Glucose-Capillary: 256 mg/dL — ABNORMAL HIGH (ref 70–99)

## 2011-03-15 LAB — HEMOGLOBIN A1C: Hgb A1c MFr Bld: 6.9 % — ABNORMAL HIGH (ref 4.6–6.1)

## 2015-05-27 ENCOUNTER — Inpatient Hospital Stay (HOSPITAL_BASED_OUTPATIENT_CLINIC_OR_DEPARTMENT_OTHER)
Admission: EM | Admit: 2015-05-27 | Discharge: 2015-06-02 | DRG: 202 | Disposition: A | Discharge status: Home / Self Care | Payer: Medicare Other | Attending: Internal Medicine | Admitting: Internal Medicine

## 2015-05-27 ENCOUNTER — Encounter (HOSPITAL_BASED_OUTPATIENT_CLINIC_OR_DEPARTMENT_OTHER): Payer: Self-pay | Admitting: *Deleted

## 2015-05-27 ENCOUNTER — Emergency Department (HOSPITAL_BASED_OUTPATIENT_CLINIC_OR_DEPARTMENT_OTHER): Payer: Medicare Other

## 2015-05-27 DIAGNOSIS — J4551 Severe persistent asthma with (acute) exacerbation: Secondary | ICD-10-CM | POA: Diagnosis not present

## 2015-05-27 DIAGNOSIS — Z23 Encounter for immunization: Secondary | ICD-10-CM | POA: Insufficient documentation

## 2015-05-27 DIAGNOSIS — Z7982 Long term (current) use of aspirin: Secondary | ICD-10-CM

## 2015-05-27 DIAGNOSIS — E038 Other specified hypothyroidism: Secondary | ICD-10-CM | POA: Diagnosis not present

## 2015-05-27 DIAGNOSIS — F329 Major depressive disorder, single episode, unspecified: Secondary | ICD-10-CM | POA: Diagnosis present

## 2015-05-27 DIAGNOSIS — I10 Essential (primary) hypertension: Secondary | ICD-10-CM | POA: Diagnosis present

## 2015-05-27 DIAGNOSIS — R0902 Hypoxemia: Secondary | ICD-10-CM

## 2015-05-27 DIAGNOSIS — E876 Hypokalemia: Secondary | ICD-10-CM | POA: Diagnosis present

## 2015-05-27 DIAGNOSIS — T380X5A Adverse effect of glucocorticoids and synthetic analogues, initial encounter: Secondary | ICD-10-CM | POA: Diagnosis present

## 2015-05-27 DIAGNOSIS — Z79899 Other long term (current) drug therapy: Secondary | ICD-10-CM

## 2015-05-27 DIAGNOSIS — J4521 Mild intermittent asthma with (acute) exacerbation: Secondary | ICD-10-CM | POA: Diagnosis not present

## 2015-05-27 DIAGNOSIS — J449 Chronic obstructive pulmonary disease, unspecified: Secondary | ICD-10-CM | POA: Diagnosis present

## 2015-05-27 DIAGNOSIS — R0602 Shortness of breath: Secondary | ICD-10-CM | POA: Insufficient documentation

## 2015-05-27 DIAGNOSIS — F32A Depression, unspecified: Secondary | ICD-10-CM | POA: Diagnosis present

## 2015-05-27 DIAGNOSIS — J9600 Acute respiratory failure, unspecified whether with hypoxia or hypercapnia: Secondary | ICD-10-CM | POA: Diagnosis present

## 2015-05-27 DIAGNOSIS — Z888 Allergy status to other drugs, medicaments and biological substances status: Secondary | ICD-10-CM | POA: Diagnosis not present

## 2015-05-27 DIAGNOSIS — J9601 Acute respiratory failure with hypoxia: Secondary | ICD-10-CM | POA: Diagnosis present

## 2015-05-27 DIAGNOSIS — Z7951 Long term (current) use of inhaled steroids: Secondary | ICD-10-CM | POA: Diagnosis not present

## 2015-05-27 DIAGNOSIS — Z881 Allergy status to other antibiotic agents status: Secondary | ICD-10-CM

## 2015-05-27 DIAGNOSIS — Z7984 Long term (current) use of oral hypoglycemic drugs: Secondary | ICD-10-CM

## 2015-05-27 DIAGNOSIS — E039 Hypothyroidism, unspecified: Secondary | ICD-10-CM | POA: Diagnosis present

## 2015-05-27 DIAGNOSIS — D72829 Elevated white blood cell count, unspecified: Secondary | ICD-10-CM | POA: Diagnosis present

## 2015-05-27 DIAGNOSIS — E119 Type 2 diabetes mellitus without complications: Secondary | ICD-10-CM | POA: Diagnosis present

## 2015-05-27 DIAGNOSIS — Z880 Allergy status to penicillin: Secondary | ICD-10-CM | POA: Diagnosis not present

## 2015-05-27 DIAGNOSIS — R0600 Dyspnea, unspecified: Secondary | ICD-10-CM | POA: Diagnosis present

## 2015-05-27 DIAGNOSIS — Z7952 Long term (current) use of systemic steroids: Secondary | ICD-10-CM | POA: Diagnosis not present

## 2015-05-27 DIAGNOSIS — R000 Tachycardia, unspecified: Secondary | ICD-10-CM | POA: Diagnosis present

## 2015-05-27 DIAGNOSIS — E0821 Diabetes mellitus due to underlying condition with diabetic nephropathy: Secondary | ICD-10-CM | POA: Diagnosis not present

## 2015-05-27 DIAGNOSIS — J45901 Unspecified asthma with (acute) exacerbation: Principal | ICD-10-CM | POA: Diagnosis present

## 2015-05-27 HISTORY — DX: Major depressive disorder, single episode, unspecified: F32.9

## 2015-05-27 HISTORY — DX: Anxiety disorder, unspecified: F41.9

## 2015-05-27 HISTORY — DX: Depression, unspecified: F32.A

## 2015-05-27 HISTORY — DX: Essential (primary) hypertension: I10

## 2015-05-27 HISTORY — DX: Type 2 diabetes mellitus without complications: E11.9

## 2015-05-27 HISTORY — DX: Unspecified asthma, uncomplicated: J45.909

## 2015-05-27 HISTORY — DX: Hypothyroidism, unspecified: E03.9

## 2015-05-27 LAB — CBC
HEMATOCRIT: 40 % (ref 36.0–46.0)
Hemoglobin: 12.5 g/dL (ref 12.0–15.0)
MCH: 28.2 pg (ref 26.0–34.0)
MCHC: 31.3 g/dL (ref 30.0–36.0)
MCV: 90.1 fL (ref 78.0–100.0)
Platelets: 359 10*3/uL (ref 150–400)
RBC: 4.44 MIL/uL (ref 3.87–5.11)
RDW: 13.9 % (ref 11.5–15.5)
WBC: 14.6 10*3/uL — AB (ref 4.0–10.5)

## 2015-05-27 LAB — CBC WITH DIFFERENTIAL/PLATELET
BLASTS: 0 %
Band Neutrophils: 13 %
Basophils Absolute: 0 10*3/uL (ref 0.0–0.1)
Basophils Relative: 0 %
Eosinophils Absolute: 0.5 10*3/uL (ref 0.0–0.7)
Eosinophils Relative: 3 %
HEMATOCRIT: 39.8 % (ref 36.0–46.0)
HEMOGLOBIN: 12.5 g/dL (ref 12.0–15.0)
LYMPHS PCT: 20 %
Lymphs Abs: 3.2 10*3/uL (ref 0.7–4.0)
MCH: 27.9 pg (ref 26.0–34.0)
MCHC: 31.4 g/dL (ref 30.0–36.0)
MCV: 88.8 fL (ref 78.0–100.0)
Metamyelocytes Relative: 0 %
Monocytes Absolute: 1.1 10*3/uL — ABNORMAL HIGH (ref 0.1–1.0)
Monocytes Relative: 7 %
Myelocytes: 0 %
NEUTROS ABS: 11.3 10*3/uL — AB (ref 1.7–7.7)
NEUTROS PCT: 57 %
NRBC: 0 /100{WBCs}
PROMYELOCYTES ABS: 0 %
Platelets: 402 10*3/uL — ABNORMAL HIGH (ref 150–400)
RBC: 4.48 MIL/uL (ref 3.87–5.11)
RDW: 14.1 % (ref 11.5–15.5)
WBC: 16.1 10*3/uL — ABNORMAL HIGH (ref 4.0–10.5)

## 2015-05-27 LAB — BASIC METABOLIC PANEL
Anion gap: 11 (ref 5–15)
BUN: 18 mg/dL (ref 6–20)
CHLORIDE: 101 mmol/L (ref 101–111)
CO2: 27 mmol/L (ref 22–32)
CREATININE: 0.97 mg/dL (ref 0.44–1.00)
Calcium: 9.3 mg/dL (ref 8.9–10.3)
GFR calc non Af Amer: >60 mL/min (ref >60)
Glucose, Bld: 117 mg/dL — ABNORMAL HIGH (ref 65–99)
Potassium: 3.2 mmol/L — ABNORMAL LOW (ref 3.5–5.1)
Sodium: 139 mmol/L (ref 135–145)

## 2015-05-27 LAB — CREATININE, SERUM
Creatinine, Ser: 1.12 mg/dL — ABNORMAL HIGH (ref 0.44–1.00)
GFR calc non Af Amer: 52 mL/min — ABNORMAL LOW (ref >60)

## 2015-05-27 LAB — GLUCOSE, CAPILLARY: Glucose-Capillary: 234 mg/dL — ABNORMAL HIGH (ref 65–99)

## 2015-05-27 MED ORDER — CLONAZEPAM 1 MG PO TABS
2.0000 mg | ORAL_TABLET | Freq: Every day | ORAL | Status: DC
  Administered 2015-05-28 – 2015-06-02 (×6): 2 mg via ORAL
  Filled 2015-05-27 (×6): qty 2

## 2015-05-27 MED ORDER — INSULIN ASPART 100 UNIT/ML ~~LOC~~ SOLN
3.0000 [IU] | Freq: Three times a day (TID) | SUBCUTANEOUS | Status: DC
  Administered 2015-05-28 – 2015-06-02 (×14): 3 [IU] via SUBCUTANEOUS

## 2015-05-27 MED ORDER — ACETAMINOPHEN 325 MG PO TABS
650.0000 mg | ORAL_TABLET | Freq: Four times a day (QID) | ORAL | Status: DC | PRN
  Administered 2015-05-28 – 2015-06-02 (×5): 650 mg via ORAL
  Filled 2015-05-27 (×5): qty 2

## 2015-05-27 MED ORDER — POLYETHYLENE GLYCOL 3350 17 G PO PACK: 17.0000 g | PACK | Freq: Every day | ORAL | Status: DC | PRN

## 2015-05-27 MED ORDER — POTASSIUM CITRATE ER 10 MEQ (1080 MG) PO TBCR
10.0000 meq | EXTENDED_RELEASE_TABLET | Freq: Two times a day (BID) | ORAL | Status: DC
  Administered 2015-05-28 – 2015-05-31 (×7): 10 meq via ORAL
  Filled 2015-05-27 (×10): qty 1

## 2015-05-27 MED ORDER — OSELTAMIVIR PHOSPHATE 75 MG PO CAPS
75.0000 mg | ORAL_CAPSULE | Freq: Two times a day (BID) | ORAL | Status: DC
Start: 2015-05-27 — End: 2015-05-28
  Administered 2015-05-27 – 2015-05-28 (×2): 75 mg via ORAL
  Filled 2015-05-27 (×4): qty 1

## 2015-05-27 MED ORDER — BUDESONIDE-FORMOTEROL FUMARATE 160-4.5 MCG/ACT IN AERO
2.0000 | INHALATION_SPRAY | Freq: Two times a day (BID) | RESPIRATORY_TRACT | Status: DC
  Administered 2015-05-28: 2 via RESPIRATORY_TRACT
  Filled 2015-05-27 (×2): qty 6

## 2015-05-27 MED ORDER — IRBESARTAN 300 MG PO TABS
150.0000 mg | ORAL_TABLET | Freq: Every day | ORAL | Status: DC
  Administered 2015-05-28 – 2015-06-02 (×6): 150 mg via ORAL
  Filled 2015-05-27 (×7): qty 1

## 2015-05-27 MED ORDER — ALBUTEROL SULFATE (2.5 MG/3ML) 0.083% IN NEBU: 10.0000 mg | INHALATION_SOLUTION | Freq: Once | RESPIRATORY_TRACT | Status: DC

## 2015-05-27 MED ORDER — ALBUTEROL (5 MG/ML) CONTINUOUS INHALATION SOLN
10.0000 mg/h | INHALATION_SOLUTION | RESPIRATORY_TRACT | Status: DC
  Administered 2015-05-27: 10 mg/h via RESPIRATORY_TRACT
  Filled 2015-05-27: qty 20

## 2015-05-27 MED ORDER — ASPIRIN 81 MG PO TABS: 81.0000 mg | ORAL_TABLET | Freq: Every day | ORAL | Status: DC

## 2015-05-27 MED ORDER — ACETAMINOPHEN 650 MG RE SUPP: 650.0000 mg | Freq: Four times a day (QID) | RECTAL | Status: DC | PRN

## 2015-05-27 MED ORDER — POTASSIUM CITRATE ER 10 MEQ (1080 MG) PO TBCR: 10.0000 meq | EXTENDED_RELEASE_TABLET | Freq: Two times a day (BID) | ORAL | Status: DC

## 2015-05-27 MED ORDER — ATORVASTATIN CALCIUM 40 MG PO TABS
40.0000 mg | ORAL_TABLET | Freq: Every day | ORAL | Status: DC
  Administered 2015-05-27 – 2015-06-02 (×7): 40 mg via ORAL
  Filled 2015-05-27 (×7): qty 1

## 2015-05-27 MED ORDER — POTASSIUM CHLORIDE IN NACL 40-0.9 MEQ/L-% IV SOLN
INTRAVENOUS | Status: AC
  Administered 2015-05-27: 75 mL/h via INTRAVENOUS
  Filled 2015-05-27: qty 1000

## 2015-05-27 MED ORDER — ALBUTEROL SULFATE (2.5 MG/3ML) 0.083% IN NEBU
2.5000 mg | INHALATION_SOLUTION | RESPIRATORY_TRACT | Status: DC | PRN
  Administered 2015-05-27 – 2015-05-28 (×2): 2.5 mg via RESPIRATORY_TRACT
  Filled 2015-05-27 (×2): qty 3

## 2015-05-27 MED ORDER — METHYLPREDNISOLONE SODIUM SUCC 125 MG IJ SOLR
60.0000 mg | Freq: Two times a day (BID) | INTRAMUSCULAR | Status: DC
  Administered 2015-05-27: 60 mg via INTRAVENOUS
  Filled 2015-05-27: qty 2

## 2015-05-27 MED ORDER — FLUTICASONE PROPIONATE 50 MCG/ACT NA SUSP
1.0000 | Freq: Every day | NASAL | Status: DC
Start: 2015-05-28 — End: 2015-05-28
  Filled 2015-05-27: qty 16

## 2015-05-27 MED ORDER — PANTOPRAZOLE SODIUM 40 MG PO TBEC
40.0000 mg | DELAYED_RELEASE_TABLET | Freq: Every day | ORAL | Status: DC
  Administered 2015-05-28 – 2015-06-02 (×6): 40 mg via ORAL
  Filled 2015-05-27 (×6): qty 1

## 2015-05-27 MED ORDER — ASPIRIN EC 81 MG PO TBEC
81.0000 mg | DELAYED_RELEASE_TABLET | Freq: Every day | ORAL | Status: DC
  Administered 2015-05-28 – 2015-06-02 (×6): 81 mg via ORAL
  Filled 2015-05-27 (×6): qty 1

## 2015-05-27 MED ORDER — INSULIN DETEMIR 100 UNIT/ML ~~LOC~~ SOLN
5.0000 [IU] | Freq: Every day | SUBCUTANEOUS | Status: DC
  Administered 2015-05-27 – 2015-06-01 (×6): 5 [IU] via SUBCUTANEOUS
  Filled 2015-05-27 (×7): qty 0.05

## 2015-05-27 MED ORDER — ALBUTEROL SULFATE (2.5 MG/3ML) 0.083% IN NEBU
2.5000 mg | INHALATION_SOLUTION | Freq: Four times a day (QID) | RESPIRATORY_TRACT | Status: DC
  Administered 2015-05-27 – 2015-05-28 (×2): 2.5 mg via RESPIRATORY_TRACT
  Filled 2015-05-27 (×2): qty 3

## 2015-05-27 MED ORDER — ROFLUMILAST 500 MCG PO TABS
500.0000 ug | ORAL_TABLET | Freq: Every day | ORAL | Status: DC
  Administered 2015-05-28 – 2015-06-02 (×6): 500 ug via ORAL
  Filled 2015-05-27 (×7): qty 1

## 2015-05-27 MED ORDER — METHYLPREDNISOLONE SODIUM SUCC 125 MG IJ SOLR
INTRAMUSCULAR | Status: AC
  Filled 2015-05-27: qty 4

## 2015-05-27 MED ORDER — ALBUTEROL SULFATE (2.5 MG/3ML) 0.083% IN NEBU
2.5000 mg | INHALATION_SOLUTION | RESPIRATORY_TRACT | Status: DC | PRN
  Administered 2015-05-27: 2.5 mg via RESPIRATORY_TRACT
  Filled 2015-05-27: qty 3

## 2015-05-27 MED ORDER — SODIUM CHLORIDE 0.9 % IV SOLN
250.0000 mg | Freq: Once | INTRAVENOUS | Status: AC
  Administered 2015-05-27: 250 mg via INTRAVENOUS
  Filled 2015-05-27: qty 2

## 2015-05-27 MED ORDER — POTASSIUM CHLORIDE CRYS ER 20 MEQ PO TBCR: 40.0000 meq | EXTENDED_RELEASE_TABLET | Freq: Two times a day (BID) | ORAL | Status: DC

## 2015-05-27 MED ORDER — ONDANSETRON HCL 4 MG/2ML IJ SOLN: 4.0000 mg | Freq: Four times a day (QID) | INTRAMUSCULAR | Status: DC | PRN

## 2015-05-27 MED ORDER — LEVOTHYROXINE SODIUM 50 MCG PO TABS
50.0000 ug | ORAL_TABLET | Freq: Every day | ORAL | Status: DC
  Administered 2015-05-28 – 2015-05-29 (×2): 50 ug via ORAL
  Filled 2015-05-27 (×2): qty 1

## 2015-05-27 MED ORDER — CLONAZEPAM 1 MG PO TABS
4.0000 mg | ORAL_TABLET | Freq: Every day | ORAL | Status: DC
  Administered 2015-05-27 – 2015-06-01 (×6): 4 mg via ORAL
  Filled 2015-05-27 (×6): qty 4

## 2015-05-27 MED ORDER — ONDANSETRON HCL 4 MG PO TABS: 4.0000 mg | ORAL_TABLET | Freq: Four times a day (QID) | ORAL | Status: DC | PRN

## 2015-05-27 MED ORDER — HEPARIN SODIUM (PORCINE) 5000 UNIT/ML IJ SOLN
5000.0000 [IU] | Freq: Three times a day (TID) | INTRAMUSCULAR | Status: DC
  Administered 2015-05-27 – 2015-05-28 (×2): 5000 [IU] via SUBCUTANEOUS
  Filled 2015-05-27: qty 1

## 2015-05-27 MED ORDER — POTASSIUM CHLORIDE CRYS ER 20 MEQ PO TBCR
40.0000 meq | EXTENDED_RELEASE_TABLET | Freq: Two times a day (BID) | ORAL | Status: AC
Start: 2015-05-27 — End: 2015-05-28
  Administered 2015-05-27 – 2015-05-28 (×2): 40 meq via ORAL
  Filled 2015-05-27 (×2): qty 2

## 2015-05-27 MED ORDER — LAMOTRIGINE 25 MG PO TABS
150.0000 mg | ORAL_TABLET | Freq: Every day | ORAL | Status: DC
Start: 2015-05-28 — End: 2015-06-02
  Administered 2015-05-28 – 2015-06-02 (×6): 150 mg via ORAL
  Filled 2015-05-27: qty 1
  Filled 2015-05-27 (×2): qty 6
  Filled 2015-05-27: qty 1
  Filled 2015-05-27: qty 6
  Filled 2015-05-27 (×2): qty 1

## 2015-05-27 MED ORDER — DULOXETINE HCL 60 MG PO CPEP
60.0000 mg | ORAL_CAPSULE | Freq: Every day | ORAL | Status: DC
  Administered 2015-05-28 – 2015-06-02 (×6): 60 mg via ORAL
  Filled 2015-05-27 (×6): qty 1

## 2015-05-27 MED ORDER — INFLUENZA VAC SPLIT QUAD 0.5 ML IM SUSY
0.5000 mL | PREFILLED_SYRINGE | INTRAMUSCULAR | Status: AC
  Administered 2015-05-31: 0.5 mL via INTRAMUSCULAR
  Filled 2015-05-27: qty 0.5

## 2015-05-27 MED ORDER — CLONAZEPAM 1 MG PO TABS: 2.0000 mg | ORAL_TABLET | Freq: Every day | ORAL | Status: DC

## 2015-05-27 MED ORDER — VALSARTAN-HYDROCHLOROTHIAZIDE 160-25 MG PO TABS: 1.0000 | ORAL_TABLET | Freq: Every day | ORAL | Status: DC

## 2015-05-27 MED ORDER — HYDROCHLOROTHIAZIDE 25 MG PO TABS
25.0000 mg | ORAL_TABLET | Freq: Every day | ORAL | Status: DC
  Administered 2015-05-28 – 2015-06-02 (×6): 25 mg via ORAL
  Filled 2015-05-27 (×7): qty 1

## 2015-05-27 MED ORDER — DEXTROSE 5 % IV SOLN
500.0000 mg | INTRAVENOUS | Status: DC
  Administered 2015-05-27: 500 mg via INTRAVENOUS
  Filled 2015-05-27 (×2): qty 500

## 2015-05-27 MED ORDER — ONDANSETRON HCL 4 MG/2ML IJ SOLN: 4.0000 mg | Freq: Three times a day (TID) | INTRAMUSCULAR | Status: DC | PRN

## 2015-05-27 MED ORDER — INSULIN ASPART 100 UNIT/ML ~~LOC~~ SOLN
0.0000 [IU] | Freq: Every day | SUBCUTANEOUS | Status: DC
  Administered 2015-05-27: 2 [IU] via SUBCUTANEOUS

## 2015-05-27 MED ORDER — DILTIAZEM HCL ER COATED BEADS 120 MG PO CP24
120.0000 mg | ORAL_CAPSULE | Freq: Every day | ORAL | Status: DC
  Administered 2015-05-27 – 2015-05-28 (×2): 120 mg via ORAL
  Filled 2015-05-27 (×2): qty 1

## 2015-05-27 MED ORDER — INSULIN ASPART 100 UNIT/ML ~~LOC~~ SOLN
0.0000 [IU] | Freq: Three times a day (TID) | SUBCUTANEOUS | Status: DC
  Administered 2015-05-28: 8 [IU] via SUBCUTANEOUS
  Administered 2015-05-28 (×2): 3 [IU] via SUBCUTANEOUS

## 2015-05-27 NOTE — ED Notes (Signed)
Patient c/o sob, used neb at 4:30 am no relief

## 2015-05-27 NOTE — H&P (Signed)
[facility] History and Physical  Teresa LusterKatheleen M Previti ZOX:[id number] DOB: 06/05/1956 DOA: 05/27/2015  Referring physician: Dr. Radford PaxBeaton PCP: No primary care provider on file.   Chief Complaint: dyspnea  HPI: [name] is a 59 y.o. female past medical history of asthma was transferred from [facility] for shortness of breath and productive cough that started about 2 days prior to admission she relates her albuterol has not helping her productive cough and her dyspnea as been getting worse. She denies any sick contacts, pets dogs or environment of tobacco. She relates to walk to the bathroom without being short of breath now she can't even walk to the hallway without being short of breath. She relates she's had some fevers but her electronic thermometer is not working. She denies any chills.  In the ED: Sound to be mildly hypokalemic, with a mild leukocytosis with left shift chest x-ray showed some peribronchial thickening   Review of Systems:  Constitutional:  No weight loss, night sweats, , chills, fatigue.  HEENT:  No headaches, Difficulty swallowing,Tooth/dental problems,Sore throat,  No sneezing, itching, ear ache, nasal congestion, post nasal drip,  Cardio-vascular:  No chest pain, Orthopnea, PND, swelling in lower extremities, anasarca, dizziness, palpitations  GI:  No heartburn, indigestion, abdominal pain, nausea, vomiting, diarrhea, change in bowel habits, loss of appetite  Resp:  No chest wall deformity  Skin:  no rash or lesions.  GU:  no dysuria, change in color of urine, no urgency or frequency. No flank pain.  Musculoskeletal:  No joint pain or swelling. No decreased range of motion. No back pain.  Psych:  No change in mood or affect. No depression or anxiety. No memory loss.   Past Medical History  Diagnosis Date  . Asthma   . Hypertension   . Hypothyroid   . Diabetes mellitus without complication (HCC)   . Anxiety   . Depression    Past  Surgical History  Procedure Laterality Date  . Abdominal hysterectomy    . Sinus endo w/fusion    . Tonsillectomy    . Dilation and curettage of uterus    . Eye surgery Bilateral     CATARACT   Social History:  reports that she has never smoked. She does not have any smokeless tobacco history on file. She reports that she does not drink alcohol or use illicit drugs.  Allergies  Allergen Reactions  . Doxycycline Other (See Comments)    Unknown reaction  . Penicillins Other (See Comments)    Has patient had a PCN reaction causing immediate rash, facial/tongue/throat swelling, SOB or lightheadedness with hypotension: No Has patient had a PCN reaction causing severe rash involving mucus membranes or skin necrosis: Yes Has patient had a PCN reaction that required hospitalization Yes Has patient had a PCN reaction occurring within the last 10 years: Yes If all of the above answers are "NO", then may proceed with Cephalosporin use.  . Sulfa Antibiotics Other (See Comments)    Unknown reaction  . Tessalon [Benzonatate] Other (See Comments)    Unknown reaction    Family History  Problem Relation Age of Onset  . Stroke Mother   . Alzheimer's disease Father   . Cancer - Other Sister   . Cancer - Ovarian Sister     Prior to Admission medications   Medication Sig Start Date End Date Taking? Authorizing Provider  aspirin 81 MG tablet Take 81 mg by mouth daily.   Yes Historical Provider, MD  atorvastatin (  LIPITOR) 40 MG tablet Take 40 mg by mouth daily.   Yes Historical Provider, MD  budesonide-formoterol (SYMBICORT) 160-4.5 MCG/ACT inhaler Inhale 2 puffs into the lungs 2 (two) times daily.   Yes Historical Provider, MD  clonazePAM (KLONOPIN) 2 MG tablet Take 2-4 mg by mouth 2 (two) times daily. Take tablet in the morning Take 2 tablets in the evening   Yes Historical Provider, MD  diltiazem (CARDIZEM CD) 120 MG 24 hr capsule Take 120 mg by mouth at bedtime.   Yes Historical Provider, MD    DULoxetine (CYMBALTA) 60 MG capsule Take 60 mg by mouth daily.   Yes Historical Provider, MD  fluticasone (FLONASE) 50 MCG/ACT nasal spray Place 1 spray into both nostrils daily.   Yes Historical Provider, MD  lamoTRIgine (LAMICTAL) 150 MG tablet Take 150 mg by mouth daily.   Yes Historical Provider, MD  levothyroxine (SYNTHROID, LEVOTHROID) 50 MCG tablet Take 50 mcg by mouth daily before breakfast.   Yes Historical Provider, MD  metFORMIN (GLUCOPHAGE) 500 MG tablet Take 500-1,000 mg by mouth 2 (two) times daily with a meal. Take 2 tablets in the morning Take 1 tablet at night   Yes Historical Provider, MD  Multiple Vitamin (MULTIVITAMIN WITH MINERALS) TABS tablet Take 1 tablet by mouth daily.   Yes Historical Provider, MD  omeprazole (PRILOSEC) 40 MG capsule Take 40 mg by mouth daily.   Yes Historical Provider, MD  potassium citrate (UROCIT-K) 10 MEQ (1080 MG) SR tablet Take 10 mEq by mouth 2 (two) times daily with a meal.    Yes Historical Provider, MD  predniSONE (DELTASONE) 10 MG tablet Take 20 mg by mouth daily with breakfast.    Yes Historical Provider, MD  predniSONE (DELTASONE) 20 MG tablet Take 20 mg by mouth daily with breakfast.   Yes Historical Provider, MD  roflumilast (DALIRESP) 500 MCG TABS tablet Take 500 mcg by mouth daily.   Yes Historical Provider, MD  theophylline (THEODUR) 300 MG 12 hr tablet Take 300 mg by mouth 2 (two) times daily.   Yes Historical Provider, MD  valsartan-hydrochlorothiazide (DIOVAN-HCT) 160-25 MG tablet Take 1 tablet by mouth daily.   Yes Historical Provider, MD  EPINEPHrine 0.15 MG/0.15ML IJ injection Inject 0.15 mg into the muscle as needed for anaphylaxis.    Historical Provider, MD   Physical Exam: Filed Vitals:   05/27/15 1400 05/27/15 1554 05/27/15 1657 05/27/15 1714  BP: 134/78 141/84 145/76   Pulse: 117 111 112   Temp:  98.5 F (36.9 C) 97.8 F (36.6 C)   TempSrc:  Oral Oral   Resp: SpO2: 94% 93% 93% 94%    Wt Readings from  Last 3 Encounters:  No data found for Wt    General:  Appears calm and comfortable Eyes: PERRL, normal lids, irises & conjunctiva ENT: grossly normal hearing, lips & tongue Neck: no LAD, masses or thyromegaly Cardiovascular: Tachycardic with regular rate, with positive S1-S2 Telemetry: SR, no arrhythmias  Respiratory: She has good air movement with wheezing bilaterally no appreciated bronchitis or crackles Abdomen: soft, ntnd Skin: no rash or induration seen on limited exam Musculoskeletal: grossly normal tone BUE/BLE Psychiatric: grossly normal mood and affect, speech fluent and appropriate Neurologic: grossly non-focal.          Labs on Admission:  Basic Metabolic Panel:  Recent Labs Lab 05/27/15 1115  NA 139  K 3.2*  CL 101  CO2 27  GLUCOSE 117*  BUN 18  CREATININE 0.97  CALCIUM 9.3   Liver Function Tests: No results for input(s): AST, ALT, ALKPHOS, BILITOT, PROT, ALBUMIN in the last 168 hours. No results for input(s): LIPASE, AMYLASE in the last 168 hours. No results for input(s): AMMONIA in the last 168 hours. CBC:  Recent Labs Lab 05/27/15 1115  WBC 16.1*  NEUTROABS 11.3*  HGB 12.5  HCT 39.8  MCV 88.8  PLT 402*   Cardiac Enzymes: No results for input(s): CKTOTAL, CKMB, CKMBINDEX, TROPONINI in the last 168 hours.  BNP (last 3 results) No results for input(s): BNP in the last 8760 hours.  ProBNP (last 3 results) No results for input(s): PROBNP in the last 8760 hours.  CBG: No results for input(s): GLUCAP in the last 168 hours.  Radiological Exams on Admission: Dg Chest 2 View  05/27/2015  CLINICAL DATA:  59 year old female with acute shortness of breath and wheezing. EXAM: CHEST  2 VIEW COMPARISON:  06/24/2008 and prior chest radiographs dating back to 04/09/2008. FINDINGS: Cardiomegaly noted. Mild peribronchial thickening and chronic right middle lobe atelectasis again noted. There is no evidence of focal airspace disease, pulmonary edema,  suspicious pulmonary nodule/mass, pleural effusion, or pneumothorax. No acute bony abnormalities are identified. IMPRESSION: Cardiomegaly without evidence of acute cardiopulmonary disease. Mild chronic peribronchial thickening and right middle lobe atelectasis. Electronically Signed   By: Harmon Pier M.D.   On: 05/27/2015 11:43    EKG: Independently reviewed. None  Assessment/Plan Dyspnea due to Asthma with acute exacerbation/leukocytosis: She has some mild peribronchial thickening on chest x-ray, she has a mild leukocytosis, but she was previously on steroids. I cannot explain her thrombocytosis. She relates he's had an excess in sputum production that is change in color. There is no fever. And no history of fever. I'll start her empirically on IV azithromycin. I will start her on IV Solu-Medrol continue his Symbicort and albuterol scheduled every 6 hours we'll add every 2 when necessary as needed. Check an influenza PCR, start on Tamiflu empirically.  Controlled diabetes mellitus (HCC) We'll DC her metformin as she will be on high-dose steroids, will need extra coverage for the derangement the steroids will cause. We'll start on Lantus plus resistant scale sliding-scale check hemoglobin A1c.  Hypothyroidism Continue Synthroid.  Essential hypertension Blood pressure is mildly elevated, it could be because of anxiety due to her shortness of breath. Continue her home regimen and recheck tomorrow morning.  Hypokalemia: Likely to albuterol we'll replete and recheck in the morning.  Depression: Cont current regimen no changes made.   Code Status: full (must indicate code status--if unknown or must be presumed, indicate so) DVT Prophylaxis:heparin Family Communication: none Disposition Plan: inpatient 2-3 days  Time spent: 75 min  Marinda Elk [facility] Pager [phone]

## 2015-05-27 NOTE — ED Provider Notes (Signed)
CSN: [id number]     Arrival date & time 05/27/15  1055 History   First MD Initiated Contact with Patient 05/27/15 1112     Chief Complaint  Patient presents with  . Shortness of Breath      HPI Patient has chief complaint of shortness of breath associated with wheezing.  Long history of COPD/asthma.  Been using home meds without relief at home.  Denies any fever or chills.  Patient currently is on prednisone. Past Medical History  Diagnosis Date  . Asthma   . Hypertension   . Hypothyroid    Past Surgical History  Procedure Laterality Date  . Abdominal hysterectomy    . Sinus endo w/fusion     No family history on file. Social History  Substance Use Topics  . Smoking status: Never Smoker   . Smokeless tobacco: None  . Alcohol Use: No   OB History    No data available     Review of Systems All other systems reviewed and are negative   Allergies  Doxycycline; Penicillins; Sulfa antibiotics; and Tessalon  Home Medications   Prior to Admission medications   Medication Sig Start Date End Date Taking? Authorizing Provider  aspirin 81 MG tablet Take 81 mg by mouth daily.   Yes Historical Provider, MD  atorvastatin (LIPITOR) 40 MG tablet Take 40 mg by mouth daily.   Yes Historical Provider, MD  budesonide-formoterol (SYMBICORT) 160-4.5 MCG/ACT inhaler Inhale 2 puffs into the lungs 2 (two) times daily.   Yes Historical Provider, MD  clonazePAM (KLONOPIN) 2 MG tablet Take 2 mg by mouth 2 (two) times daily.   Yes Historical Provider, MD  DULoxetine (CYMBALTA) 60 MG capsule Take 60 mg by mouth daily.   Yes Historical Provider, MD  EPINEPHrine 0.15 MG/0.15ML IJ injection Inject 0.15 mg into the muscle as needed for anaphylaxis.   Yes Historical Provider, MD  fluticasone (FLONASE) 50 MCG/ACT nasal spray Place 1 spray into both nostrils daily.   Yes Historical Provider, MD  lamoTRIgine (LAMICTAL) 150 MG tablet Take 150 mg by mouth daily.   Yes Historical Provider, MD   levothyroxine (SYNTHROID, LEVOTHROID) 50 MCG tablet Take 50 mcg by mouth daily before breakfast.   Yes Historical Provider, MD  metFORMIN (GLUCOPHAGE) 500 MG tablet Take by mouth 2 (two) times daily with a meal.   Yes Historical Provider, MD  omeprazole (PRILOSEC) 40 MG capsule Take 40 mg by mouth daily.   Yes Historical Provider, MD  potassium citrate (UROCIT-K) 10 MEQ (1080 MG) SR tablet Take 10 mEq by mouth 3 (three) times daily with meals.   Yes Historical Provider, MD  predniSONE (DELTASONE) 10 MG tablet Take 10 mg by mouth daily with breakfast.   Yes Historical Provider, MD  roflumilast (DALIRESP) 500 MCG TABS tablet Take 500 mcg by mouth daily.   Yes Historical Provider, MD  theophylline (THEODUR) 300 MG 12 hr tablet Take 300 mg by mouth 2 (two) times daily.   Yes Historical Provider, MD  valsartan-hydrochlorothiazide (DIOVAN-HCT) 160-25 MG tablet Take 1 tablet by mouth daily.   Yes Historical Provider, MD   BP 131/66 mmHg  Pulse 123  Temp(Src) 98.2 F (36.8 C) (Oral)  Resp 15  SpO2 99% Physical Exam Physical Exam  Nursing note and vitals reviewed. Constitutional: She is oriented to person, place, and time. She appears well-developed and well-nourished. Mild respiratory distress.  HENT:  Head: Normocephalic and atraumatic.  Eyes: Pupils are equal, round, and reactive to light.  Neck: Normal range  of motion.  Cardiovascular: Normal rate and intact distal pulses.   Pulmonary/Chest: patient has mild respiratory distress with tachypnea and use of accessory muscles.  Patient has wheezing to auscultation all lung fields. Abdominal: Normal appearance. She exhibits no distension.  Musculoskeletal: Normal range of motion.  Neurological: She is alert and oriented to person, place, and time. No cranial nerve deficit.  Skin: Skin is warm and dry. No rash noted.    ED Course  Procedures (including critical care time) Medications  albuterol (PROVENTIL,VENTOLIN) solution continuous neb (0  mg/hr Nebulization Stopped 05/27/15 1237)  albuterol (PROVENTIL) (2.5 MG/3ML) 0.083% nebulizer solution 10 mg ( Nebulization Canceled Entry 05/27/15 1125)  methylPREDNISolone sodium succinate (SOLU-MEDROL) 125 mg/2 mL injection (  Not Given 05/27/15 1232)  methylPREDNISolone sodium succinate (SOLU-MEDROL) 250 mg in sodium chloride 0.9 % 50 mL IVPB (250 mg Intravenous Given 05/27/15 1155)    Labs Review Labs Reviewed  CBC WITH DIFFERENTIAL/PLATELET - Abnormal; Notable for the following:    WBC 16.1 (*)    Platelets 402 (*)    Neutro Abs 11.3 (*)    Monocytes Absolute 1.1 (*)    All other components within normal limits  BASIC METABOLIC PANEL - Abnormal; Notable for the following:    Potassium 3.2 (*)    Glucose, Bld 117 (*)    All other components within normal limits    Imaging Review Dg Chest 2 View  05/27/2015  CLINICAL DATA:  59 year old female with acute shortness of breath and wheezing. EXAM: CHEST  2 VIEW COMPARISON:  06/24/2008 and prior chest radiographs dating back to 04/09/2008. FINDINGS: Cardiomegaly noted. Mild peribronchial thickening and chronic right middle lobe atelectasis again noted. There is no evidence of focal airspace disease, pulmonary edema, suspicious pulmonary nodule/mass, pleural effusion, or pneumothorax. No acute bony abnormalities are identified. IMPRESSION: Cardiomegaly without evidence of acute cardiopulmonary disease. Mild chronic peribronchial thickening and right middle lobe atelectasis. Electronically Signed   By: Harmon PierJeffrey  Hu M.D.   On: 05/27/2015 11:43   I have personally reviewed and evaluated these images and lab results as part of my medical decision-making.    MDM   Final diagnoses:  Chronic obstructive pulmonary disease, unspecified COPD type (HCC)  Hypoxia        [name] Teresa Blumenstock, MD 05/27/15 1321

## 2015-05-28 ENCOUNTER — Inpatient Hospital Stay (HOSPITAL_COMMUNITY): Payer: Medicare Other

## 2015-05-28 ENCOUNTER — Encounter (HOSPITAL_COMMUNITY): Payer: Self-pay | Admitting: Radiology

## 2015-05-28 DIAGNOSIS — F329 Major depressive disorder, single episode, unspecified: Secondary | ICD-10-CM

## 2015-05-28 DIAGNOSIS — R0902 Hypoxemia: Secondary | ICD-10-CM | POA: Insufficient documentation

## 2015-05-28 DIAGNOSIS — J4551 Severe persistent asthma with (acute) exacerbation: Secondary | ICD-10-CM

## 2015-05-28 DIAGNOSIS — R0602 Shortness of breath: Secondary | ICD-10-CM | POA: Insufficient documentation

## 2015-05-28 DIAGNOSIS — J9600 Acute respiratory failure, unspecified whether with hypoxia or hypercapnia: Secondary | ICD-10-CM

## 2015-05-28 LAB — BLOOD GAS, ARTERIAL
ACID-BASE DEFICIT: 0.4 mmol/L (ref 0.0–2.0)
Bicarbonate: 23.9 mEq/L (ref 20.0–24.0)
Drawn by: 283381
O2 Content: 4 L/min
O2 SAT: 95.1 %
PATIENT TEMPERATURE: 98.6
PCO2 ART: 40.2 mmHg (ref 35.0–45.0)
PO2 ART: 76 mmHg — AB (ref 80.0–100.0)
TCO2: 25.2 mmol/L (ref 0–100)
pH, Arterial: 7.392 (ref 7.350–7.450)

## 2015-05-28 LAB — BASIC METABOLIC PANEL
ANION GAP: 11 (ref 5–15)
BUN: 17 mg/dL (ref 6–20)
CALCIUM: 9.2 mg/dL (ref 8.9–10.3)
CO2: 24 mmol/L (ref 22–32)
Chloride: 104 mmol/L (ref 101–111)
Creatinine, Ser: 0.85 mg/dL (ref 0.44–1.00)
GFR calc Af Amer: >60 mL/min (ref >60)
Glucose, Bld: 166 mg/dL — ABNORMAL HIGH (ref 65–99)
POTASSIUM: 4.9 mmol/L (ref 3.5–5.1)
SODIUM: 139 mmol/L (ref 135–145)

## 2015-05-28 LAB — INFLUENZA PANEL BY PCR (TYPE A & B)
H1N1 flu by pcr: NOT DETECTED
INFLAPCR: NEGATIVE
INFLBPCR: NEGATIVE

## 2015-05-28 LAB — URINALYSIS, ROUTINE W REFLEX MICROSCOPIC
BILIRUBIN URINE: NEGATIVE
GLUCOSE, UA: 100 mg/dL — AB
KETONES UR: NEGATIVE mg/dL
Leukocytes, UA: NEGATIVE
NITRITE: NEGATIVE
PH: 6 (ref 5.0–8.0)
Protein, ur: NEGATIVE mg/dL
SPECIFIC GRAVITY, URINE: 1.025 (ref 1.005–1.030)

## 2015-05-28 LAB — EXPECTORATED SPUTUM ASSESSMENT W REFEX TO RESP CULTURE

## 2015-05-28 LAB — MRSA PCR SCREENING: MRSA BY PCR: NEGATIVE

## 2015-05-28 LAB — CBC
HCT: 38.9 % (ref 36.0–46.0)
Hemoglobin: 12.5 g/dL (ref 12.0–15.0)
MCH: 28.9 pg (ref 26.0–34.0)
MCHC: 32.1 g/dL (ref 30.0–36.0)
MCV: 90 fL (ref 78.0–100.0)
PLATELETS: 347 10*3/uL (ref 150–400)
RBC: 4.32 MIL/uL (ref 3.87–5.11)
RDW: 14.2 % (ref 11.5–15.5)
WBC: 14.4 10*3/uL — AB (ref 4.0–10.5)

## 2015-05-28 LAB — URINE MICROSCOPIC-ADD ON

## 2015-05-28 LAB — GLUCOSE, CAPILLARY
GLUCOSE-CAPILLARY: 152 mg/dL — AB (ref 65–99)
Glucose-Capillary: 167 mg/dL — ABNORMAL HIGH (ref 65–99)
Glucose-Capillary: 267 mg/dL — ABNORMAL HIGH (ref 65–99)
Glucose-Capillary: 196 mg/dL — ABNORMAL HIGH (ref 65–99)

## 2015-05-28 LAB — EXPECTORATED SPUTUM ASSESSMENT W GRAM STAIN, RFLX TO RESP C

## 2015-05-28 MED ORDER — IPRATROPIUM-ALBUTEROL 0.5-2.5 (3) MG/3ML IN SOLN
3.0000 mL | Freq: Four times a day (QID) | RESPIRATORY_TRACT | Status: DC
  Administered 2015-05-28: 3 mL via RESPIRATORY_TRACT
  Filled 2015-05-28: qty 3

## 2015-05-28 MED ORDER — ALBUTEROL (5 MG/ML) CONTINUOUS INHALATION SOLN: 10.0000 mg/h | INHALATION_SOLUTION | RESPIRATORY_TRACT | Status: DC

## 2015-05-28 MED ORDER — FLUTICASONE PROPIONATE 50 MCG/ACT NA SUSP
2.0000 | Freq: Every day | NASAL | Status: DC
  Administered 2015-05-28 – 2015-06-02 (×6): 2 via NASAL
  Filled 2015-05-28 (×2): qty 16

## 2015-05-28 MED ORDER — IPRATROPIUM BROMIDE 0.02 % IN SOLN
0.5000 mg | RESPIRATORY_TRACT | Status: DC
  Administered 2015-05-28 – 2015-05-30 (×13): 0.5 mg via RESPIRATORY_TRACT
  Filled 2015-05-28 (×13): qty 2.5

## 2015-05-28 MED ORDER — ARFORMOTEROL TARTRATE 15 MCG/2ML IN NEBU
15.0000 ug | INHALATION_SOLUTION | Freq: Two times a day (BID) | RESPIRATORY_TRACT | Status: DC
  Administered 2015-05-28 – 2015-06-02 (×11): 15 ug via RESPIRATORY_TRACT
  Filled 2015-05-28 (×12): qty 2

## 2015-05-28 MED ORDER — IPRATROPIUM BROMIDE 0.02 % IN SOLN: 0.5000 mg | RESPIRATORY_TRACT | Status: DC | PRN

## 2015-05-28 MED ORDER — ALBUTEROL (5 MG/ML) CONTINUOUS INHALATION SOLN
10.0000 mg/h | INHALATION_SOLUTION | RESPIRATORY_TRACT | Status: DC
  Filled 2015-05-28: qty 20

## 2015-05-28 MED ORDER — BENZONATATE 100 MG PO CAPS: 200.0000 mg | ORAL_CAPSULE | Freq: Three times a day (TID) | ORAL | Status: DC

## 2015-05-28 MED ORDER — LEVALBUTEROL HCL 1.25 MG/0.5ML IN NEBU: 1.2500 mg | INHALATION_SOLUTION | Freq: Four times a day (QID) | RESPIRATORY_TRACT | Status: DC

## 2015-05-28 MED ORDER — MAGNESIUM SULFATE 2 GM/50ML IV SOLN
2.0000 g | Freq: Once | INTRAVENOUS | Status: AC
  Administered 2015-05-28: 2 g via INTRAVENOUS
  Filled 2015-05-28 (×2): qty 50

## 2015-05-28 MED ORDER — IOHEXOL 350 MG/ML SOLN
80.0000 mL | Freq: Once | INTRAVENOUS | Status: AC | PRN
  Administered 2015-05-28: 80 mL via INTRAVENOUS

## 2015-05-28 MED ORDER — MAGNESIUM SULFATE 50 % IJ SOLN: 2.0000 g | Freq: Once | INTRAMUSCULAR | Status: DC

## 2015-05-28 MED ORDER — LEVALBUTEROL HCL 1.25 MG/0.5ML IN NEBU
1.2500 mg | INHALATION_SOLUTION | RESPIRATORY_TRACT | Status: DC
  Administered 2015-05-28 – 2015-05-30 (×12): 1.25 mg via RESPIRATORY_TRACT
  Filled 2015-05-28 (×12): qty 0.5

## 2015-05-28 MED ORDER — LEVOFLOXACIN IN D5W 500 MG/100ML IV SOLN
500.0000 mg | INTRAVENOUS | Status: DC
  Administered 2015-05-28 – 2015-05-29 (×2): 500 mg via INTRAVENOUS
  Filled 2015-05-28 (×3): qty 100

## 2015-05-28 MED ORDER — GUAIFENESIN 100 MG/5ML PO SOLN
5.0000 mL | ORAL | Status: DC | PRN
  Administered 2015-05-28 – 2015-05-29 (×2): 100 mg via ORAL
  Filled 2015-05-28 (×2): qty 5

## 2015-05-28 MED ORDER — HYDRALAZINE HCL 20 MG/ML IJ SOLN: 10.0000 mg | Freq: Four times a day (QID) | INTRAMUSCULAR | Status: DC | PRN

## 2015-05-28 MED ORDER — LORATADINE 10 MG PO TABS
10.0000 mg | ORAL_TABLET | Freq: Every day | ORAL | Status: DC
  Administered 2015-05-28 – 2015-06-02 (×6): 10 mg via ORAL
  Filled 2015-05-28 (×6): qty 1

## 2015-05-28 MED ORDER — DM-GUAIFENESIN ER 30-600 MG PO TB12
2.0000 | ORAL_TABLET | Freq: Two times a day (BID) | ORAL | Status: DC
  Administered 2015-05-28 – 2015-05-31 (×6): 2 via ORAL
  Filled 2015-05-28: qty 2
  Filled 2015-05-28: qty 1
  Filled 2015-05-28 (×4): qty 2

## 2015-05-28 MED ORDER — BUDESONIDE 0.25 MG/2ML IN SUSP
0.2500 mg | Freq: Two times a day (BID) | RESPIRATORY_TRACT | Status: DC
  Administered 2015-05-28 – 2015-06-02 (×11): 0.25 mg via RESPIRATORY_TRACT
  Filled 2015-05-28 (×12): qty 2

## 2015-05-28 MED ORDER — METHYLPREDNISOLONE SODIUM SUCC 125 MG IJ SOLR
125.0000 mg | Freq: Four times a day (QID) | INTRAMUSCULAR | Status: DC
  Administered 2015-05-28 – 2015-05-29 (×5): 125 mg via INTRAVENOUS
  Filled 2015-05-28 (×5): qty 2

## 2015-05-28 MED ORDER — ALBUTEROL SULFATE (2.5 MG/3ML) 0.083% IN NEBU
INHALATION_SOLUTION | RESPIRATORY_TRACT | Status: AC
  Administered 2015-05-28: 10 mg
  Filled 2015-05-28: qty 12

## 2015-05-28 MED ORDER — ENOXAPARIN SODIUM 40 MG/0.4ML ~~LOC~~ SOLN
40.0000 mg | SUBCUTANEOUS | Status: DC
  Administered 2015-05-28 – 2015-06-02 (×6): 40 mg via SUBCUTANEOUS
  Filled 2015-05-28 (×6): qty 0.4

## 2015-05-28 MED ORDER — LEVALBUTEROL HCL 0.63 MG/3ML IN NEBU
0.6300 mg | INHALATION_SOLUTION | RESPIRATORY_TRACT | Status: DC | PRN
  Administered 2015-05-31: 0.63 mg via RESPIRATORY_TRACT

## 2015-05-28 NOTE — Progress Notes (Signed)
Pt seen by respiratory therapy and given duoneb. Pt still short of breath and wheezing.  RT advised continuous neb.  Rapid response RN advised will need to move to stepdown for continuous neb

## 2015-05-28 NOTE — Progress Notes (Signed)
[facility] PROGRESS NOTE  [name] ZOX:[id number] DOB: 10/15/1955 DOA: 05/27/2015 PCP: No primary care provider on file.  Assessment/Plan: #1 acute respiratory distress secondary to acute asthma exacerbation Likely secondary to acute asthma exacerbation. Patient is visiting from [place], had [facility] with a 2 day history of worsening shortness of breath and productive cough with no improvement on her albuterol. 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 Patient admitted chest x-ray done on admission was negative for any acute infiltrate, however did show mild chronic peribronchial thickening and right middle lobe atelectasis. Patient was placed on Symbicort, DuoNeb nebs, every 12 Solu-Medrol, dairesp, empiric Tamiflu. Influenza panel pending. Patient with worsening symptoms with no significant improvement. Will transfer to the stepdown unit. Check ABG. Check a stat chest x-ray. Check a sputum Gram stain and culture. Change Solu-Medrol to 125 mg IV every 6 hours. Increase Flonase to 2 sprays daily. Add Claritin. Discontinue DuoNeb nebs. We'll give magnesium 2 g IV 1. Will give continuous nebs of Xopenex and Atrovent. Place on scheduled Xopenex nebs and Atrovent nebs. DC Symbicort. Place on Pulmicort and Brovana. Place on IV Levaquin. BiPAP as needed. Follow. If worsening symptoms will need to consult with pulmonary.  #2 leukocytosis Patient states she is on chronic steroids. Likely steroid induced. Repeat chest x-ray as chest x-ray on admission negative for any acute infiltrate. Check a UA with cultures and sensitivities. Patient on IV azithromycin. Will add IV Levaquin. Follow.  #3 hypothyroidism Check a TSH. Continue home dose Synthroid.  #4 hypertension Continue Cardizem, HCTZ, Avapro.  #5 diabetes mellitus Hemoglobin A1c pending. Continue Lantus and resistance sliding scale insulin.  #6 depression Stable. Continue Cymbalta,  Lamictal, Klonopin.  #7 hypokalemia Repleted.  #8 prophylaxis PPI for GI prophylaxis. Lovenox for DVT prophylaxis.  Code Status: Full Family Communication: Updated patient. No family at bedside. Disposition Plan: Transfer to the stepdown unit.   Consultants:  None  Procedures:  Chest x-ray 05/27/2015  Antibiotics:  IV Azithromycin 05/27/2015  HPI/Subjective: Was called by rapid response as patient noted to be in respiratory distress with some use of accessory muscles of respiration, worsening shortness of breath, wheezing.  Objective: Filed Vitals:   05/28/15 0513 05/28/15 0609  BP: 162/89 173/95  Pulse: 104 104  Temp: 98.2 F (36.8 C)   Resp: 20 26    Intake/Output Summary (Last 24 hours) at 05/28/15 0747 Last data filed at 05/28/15 0600  Gross per 24 hour  Intake   1250 ml  Output    100 ml  Net   1150 ml   Filed Weights   05/27/15 2049  Weight: 76.5 kg (168 lb 10.4 oz)    Exam:   General:  Use of accessory muscles of respiration, however speaking in full sentences.  Cardiovascular: Tachycardia  Respiratory: Poor - fair air movement. Inspiratory and expiratory wheezing. Coarse BS in upper lobes.  Abdomen: Soft, nontender, nondistended, positive bowel sounds.  Musculoskeletal: No clubbing cyanosis or edema.  Data Reviewed: Basic Metabolic Panel:  Recent Labs Lab 05/27/15 1115 05/27/15 1948 05/28/15 0410  NA 139  --  139  K 3.2*  --  4.9  CL 101  --  104  CO2 27  --  24  GLUCOSE 117*  --  166*  BUN 18  --  17  CREATININE 0.97 1.12* 0.85  CALCIUM 9.3  --  9.2   Liver Function Tests: No results for input(s): AST, ALT, ALKPHOS, BILITOT, PROT, ALBUMIN in  the last 168 hours. No results for input(s): LIPASE, AMYLASE in the last 168 hours. No results for input(s): AMMONIA in the last 168 hours. CBC:  Recent Labs Lab 05/27/15 1115 05/27/15 1948 05/28/15 0410  WBC 16.1* 14.6* 14.4*  NEUTROABS 11.3*  --   --   HGB 12.5 12.5 12.5  HCT  39.8 40.0 38.9  MCV 88.8 90.1 90.0  PLT 402* 359 347   Cardiac Enzymes: No results for input(s): CKTOTAL, CKMB, CKMBINDEX, TROPONINI in the last 168 hours. BNP (last 3 results) No results for input(s): BNP in the last 8760 hours.  ProBNP (last 3 results) No results for input(s): PROBNP in the last 8760 hours.  CBG:  Recent Labs Lab 05/27/15 2059  GLUCAP 234*    No results found for this or any previous visit (from the past 240 hour(s)).   Studies: Dg Chest 2 View  05/27/2015  CLINICAL DATA:  59 year old female with acute shortness of breath and wheezing. EXAM: CHEST  2 VIEW COMPARISON:  06/24/2008 and prior chest radiographs dating back to 04/09/2008. FINDINGS: Cardiomegaly noted. Mild peribronchial thickening and chronic right middle lobe atelectasis again noted. There is no evidence of focal airspace disease, pulmonary edema, suspicious pulmonary nodule/mass, pleural effusion, or pneumothorax. No acute bony abnormalities are identified. IMPRESSION: Cardiomegaly without evidence of acute cardiopulmonary disease. Mild chronic peribronchial thickening and right middle lobe atelectasis. Electronically Signed   By: Harmon Pier M.D.   On: 05/27/2015 11:43    Scheduled Meds: . aspirin EC  81 mg Oral Daily  . atorvastatin  40 mg Oral Daily  . azithromycin  500 mg Intravenous Q24H  . budesonide-formoterol  2 puff Inhalation BID  . clonazePAM  2 mg Oral Daily  . clonazePAM  4 mg Oral QHS  . diltiazem  120 mg Oral QHS  . DULoxetine  60 mg Oral Daily  . fluticasone  1 spray Each Nare Daily  . heparin  5,000 Units Subcutaneous 3 times per day  . hydrochlorothiazide  25 mg Oral Daily  . Influenza vac split quadrivalent PF  0.5 mL Intramuscular Tomorrow-1000  . insulin aspart  0-15 Units Subcutaneous TID WC  . insulin aspart  0-5 Units Subcutaneous QHS  . insulin aspart  3 Units Subcutaneous TID WC  . insulin detemir  5 Units Subcutaneous QHS  . ipratropium  0.5 mg Nebulization Q4H   . ipratropium-albuterol  3 mL Nebulization Q6H  . irbesartan  150 mg Oral Daily  . lamoTRIgine  150 mg Oral Daily  . levalbuterol  1.25 mg Nebulization 4 times per day  . levothyroxine  50 mcg Oral QAC breakfast  . magnesium sulfate 1 - 4 g bolus IVPB  2 g Intravenous Once  . methylPREDNISolone (SOLU-MEDROL) injection  125 mg Intravenous 4 times per day  . oseltamivir  75 mg Oral BID  . pantoprazole  40 mg Oral Daily  . potassium chloride  40 mEq Oral BID AC & HS  . potassium citrate  10 mEq Oral BID WC  . roflumilast  500 mcg Oral Daily   Continuous Infusions: . 0.9 % NaCl with KCl 40 mEq / L 75 mL/hr (05/27/15 2304)  . albuterol      Principal Problem:   Acute respiratory failure (HCC) Active Problems:   Asthma with acute exacerbation in adult   Asthma with acute exacerbation   Controlled diabetes mellitus (HCC)   Hypothyroidism   Essential hypertension   Depression   Dyspnea    Time spent: 106  MINS    [facility] MD [facility] Pager [phone]. If 7PM-7AM, please contact night-coverage at [email or web address], password [facility] 05/28/2015, 7:47 AM  LOS: 1 day

## 2015-05-28 NOTE — Progress Notes (Signed)
Night on-call provider called back regarding situation.  RN attempted to call 2H for report, but RN at the time was busy.  Patient transferred with rapid response RN to admitting unit.

## 2015-05-28 NOTE — Progress Notes (Signed)
RT recommended continuous breathing tx.  On-call paged twice regarding situation.  However, no response.  Rapid response RN, April, notified of situation.  Orders for transfer placed.  Patient feels slightly better after breathing treatment administered by RT.  Will continue to monitor.  Charge RN, Freight forwarderCheryl, notified.

## 2015-05-28 NOTE — Progress Notes (Signed)
Upon entering room, patient was audibly wheezing.  Labored breathing.  Vital signs checked: oxygen sats at 94% on 3.5L [place], HR low 100s, respiratory rate is 28, and BP 173/95 this AM. Patient also complaining of horrible headache.  Notified MD on-call.  Added Duonebs q6 hours.  Respiratory therapist called to administer albuterol breathing treatment.  Will ask RT to assess if patient requires continuous breathing treatment per request of on-call.  Will continue to monitor.

## 2015-05-28 NOTE — Progress Notes (Signed)
RT gave pt a sputum sample cup to use for a sputum sample. The pt was unable to provide a sample at this time. RT left the sample cup in the pt's room. RN notified.

## 2015-05-28 NOTE — Progress Notes (Signed)
Influenza panel NEGATIVE. Will discontinue droplet precautions per protocol.   Rise PaganiniURRY, Kedarius Aloisi R, RN

## 2015-05-29 ENCOUNTER — Ambulatory Visit (HOSPITAL_COMMUNITY): Payer: Medicare Other

## 2015-05-29 DIAGNOSIS — J9601 Acute respiratory failure with hypoxia: Secondary | ICD-10-CM

## 2015-05-29 DIAGNOSIS — R0602 Shortness of breath: Secondary | ICD-10-CM

## 2015-05-29 LAB — BASIC METABOLIC PANEL
ANION GAP: 9 (ref 5–15)
BUN: 23 mg/dL — ABNORMAL HIGH (ref 6–20)
CALCIUM: 9 mg/dL (ref 8.9–10.3)
CHLORIDE: 106 mmol/L (ref 101–111)
CO2: 24 mmol/L (ref 22–32)
Creatinine, Ser: 0.93 mg/dL (ref 0.44–1.00)
GFR calc non Af Amer: >60 mL/min (ref >60)
Glucose, Bld: 182 mg/dL — ABNORMAL HIGH (ref 65–99)
POTASSIUM: 4.1 mmol/L (ref 3.5–5.1)
Sodium: 139 mmol/L (ref 135–145)

## 2015-05-29 LAB — CBC WITH DIFFERENTIAL/PLATELET
BASOS PCT: 0 %
Basophils Absolute: 0 10*3/uL (ref 0.0–0.1)
EOS ABS: 0 10*3/uL (ref 0.0–0.7)
EOS PCT: 0 %
HCT: 38.8 % (ref 36.0–46.0)
HEMOGLOBIN: 12.3 g/dL (ref 12.0–15.0)
Lymphocytes Relative: 3 %
Lymphs Abs: 0.7 10*3/uL (ref 0.7–4.0)
MCH: 28.9 pg (ref 26.0–34.0)
MCHC: 31.7 g/dL (ref 30.0–36.0)
MCV: 91.1 fL (ref 78.0–100.0)
Monocytes Absolute: 0.8 10*3/uL (ref 0.1–1.0)
Monocytes Relative: 4 %
NEUTROS PCT: 93 %
Neutro Abs: 18.9 10*3/uL — ABNORMAL HIGH (ref 1.7–7.7)
PLATELETS: 337 10*3/uL (ref 150–400)
RBC: 4.26 MIL/uL (ref 3.87–5.11)
RDW: 14.5 % (ref 11.5–15.5)
WBC: 20.3 10*3/uL — AB (ref 4.0–10.5)

## 2015-05-29 LAB — TSH: TSH: 0.285 u[IU]/mL — AB (ref 0.350–4.500)

## 2015-05-29 LAB — GLUCOSE, CAPILLARY
Glucose-Capillary: 190 mg/dL — ABNORMAL HIGH (ref 65–99)
Glucose-Capillary: 178 mg/dL — ABNORMAL HIGH (ref 65–99)
Glucose-Capillary: 243 mg/dL — ABNORMAL HIGH (ref 65–99)

## 2015-05-29 LAB — HEMOGLOBIN A1C
HEMOGLOBIN A1C: 7.6 % — AB (ref 4.8–5.6)
MEAN PLASMA GLUCOSE: 171 mg/dL

## 2015-05-29 LAB — MAGNESIUM: Magnesium: 2.3 mg/dL (ref 1.7–2.4)

## 2015-05-29 MED ORDER — METHYLPREDNISOLONE SODIUM SUCC 125 MG IJ SOLR
125.0000 mg | Freq: Three times a day (TID) | INTRAMUSCULAR | Status: DC
Start: 2015-05-29 — End: 2015-05-30
  Administered 2015-05-29 – 2015-05-30 (×3): 125 mg via INTRAVENOUS
  Filled 2015-05-29 (×3): qty 2

## 2015-05-29 MED ORDER — INSULIN ASPART 100 UNIT/ML ~~LOC~~ SOLN
0.0000 [IU] | Freq: Every day | SUBCUTANEOUS | Status: DC
  Administered 2015-05-31 – 2015-06-01 (×2): 2 [IU] via SUBCUTANEOUS

## 2015-05-29 MED ORDER — INSULIN ASPART 100 UNIT/ML ~~LOC~~ SOLN
0.0000 [IU] | Freq: Three times a day (TID) | SUBCUTANEOUS | Status: DC
  Administered 2015-05-29: 4 [IU] via SUBCUTANEOUS
  Administered 2015-05-29: 7 [IU] via SUBCUTANEOUS
  Administered 2015-05-30 (×3): 4 [IU] via SUBCUTANEOUS
  Administered 2015-05-31: 7 [IU] via SUBCUTANEOUS
  Administered 2015-05-31 – 2015-06-02 (×5): 4 [IU] via SUBCUTANEOUS

## 2015-05-29 MED ORDER — LEVOTHYROXINE SODIUM 75 MCG PO TABS
37.5000 ug | ORAL_TABLET | Freq: Every day | ORAL | Status: DC
  Administered 2015-05-30 – 2015-06-02 (×4): 37.5 ug via ORAL
  Filled 2015-05-29 (×5): qty 1

## 2015-05-29 MED ORDER — DILTIAZEM HCL ER COATED BEADS 120 MG PO CP24
120.0000 mg | ORAL_CAPSULE | Freq: Every day | ORAL | Status: DC
  Administered 2015-05-29 – 2015-06-02 (×5): 120 mg via ORAL
  Filled 2015-05-29 (×5): qty 1

## 2015-05-29 NOTE — Care Management Note (Signed)
Case Management Note  Patient Details  Name: Odis LusterKatheleen M Ehrmann MRN: [id number] Date of Birth: 08/15/1955  Subjective/Objective:  Adm w resp failure                  Action/Plan: lives w fam  Expected Discharge Date:                 Expected Discharge Plan:     In-House Referral:     Discharge planning Services     Post Acute Care Choice:    Choice offered to:     DME Arranged:    DME Agency:     HH Arranged:    HH Agency:     Status of Service:     Medicare Important Message Given:    Date Medicare IM Given:    Medicare IM give by:    Date Additional Medicare IM Given:    Additional Medicare Important Message give by:     If discussed at Long Length of Stay Meetings, dates discussed:    Additional Comments: ur review done  Hanley HaysDowell, Zarria Towell T, RN 05/29/2015, 9:42 AM

## 2015-05-29 NOTE — Progress Notes (Signed)
Inpatient Diabetes Program Recommendations  AACE/ADA: New Consensus Statement on Inpatient Glycemic Control (2015)  Target Ranges:  Prepandial:   less than 140 mg/dL      Peak postprandial:   less than 180 mg/dL (1-2 hours)      Critically ill patients:  140 - 180 mg/dL   Review of Glycemic Control  Inpatient Diabetes Program Recommendations:  Diet: add carb modified to current heart healthy diet Thank you  Rosalind Guido BSN, RN,CDE Inpatient Diabetes Coordinator [phone] (team pager)    

## 2015-05-29 NOTE — Progress Notes (Signed)
VASCULAR LAB PRELIMINARY  PRELIMINARY  PRELIMINARY  PRELIMINARY  Bilateral lower extremity venous duplex completed.    Preliminary report:  There is no DVT or SVT noted in the bilateral lower extremities.   Navayah Sok, RVT 05/29/2015, 12:46 PM

## 2015-05-29 NOTE — Progress Notes (Signed)
[facility] PROGRESS NOTE  [name] RUE:[id number] DOB: December 15, 1955 DOA: 05/27/2015 PCP: No primary care provider on file.  Assessment/Plan: #1 acute respiratory distress secondary to acute asthma exacerbation Likely secondary to acute asthma exacerbation. Patient is visiting from [place], had presented to med Center at [facility] with a 2 day history of worsening shortness of breath and productive cough with no improvement on her albuterol. 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 Patient admitted chest x-ray done on admission was negative for any acute infiltrate, however did show mild chronic peribronchial thickening and right middle lobe atelectasis. Patient was placed on Symbicort, DuoNeb nebs, every 12 Solu-Medrol, dairesp, empiric Tamiflu. Influenza panel ordered. Patient with worsening symptoms with no significant improvement on 05/28/2015. Patient was subsequently transferred to the stepdown unit. Influenza panel was negative. Repeat chest x-ray done was negative for any acute infiltrate. Sputum Gram stain and culture pending. Patient with clinical improvement. Change IV Solu-Medrol 125 mg IV every 8 hours. Continue Flonase. Continue Claritin, Xopenex and Atrovent nebs, Mucinex, Pulmicort, Brovana, IV Levaquin.  #2 leukocytosis Patient states she is on chronic steroids. Likely steroid induced. Repeat chest x-ray as chest x-ray on admission negative for any acute infiltrate. Urinalysis is nitrite negative leukocytes negative. Increasing leukocytosis secondary to increased steroids. Continue IV Levaquin.  #3 sinus tachycardia Per nursing patient states she's always been tachycardic. Tachycardia likely a component of nebulizer treatments and acute asthma exacerbation. Patient's TSH was 0.285. Will decrease Synthroid dose. Continue home dose Cardizem. Follow.  #4 hypothyroidism TSH is 0.285. Decrease Synthroid to 37.5 mcg daily.  #5 hypertension Continue  Cardizem, HCTZ, Avapro.  #6 diabetes mellitus Hemoglobin A1c 7.6. CBGs have ranged from 152-196. Continue Lantus and resistance sliding scale insulin.  #7 depression Stable. Continue Cymbalta, Lamictal, Klonopin.  #8 hypokalemia Repleted.  #9 prophylaxis PPI for GI prophylaxis. Lovenox for DVT prophylaxis.  Code Status: Full Family Communication: Updated patient. No family at bedside. Disposition Plan: Remain in to the stepdown unit.   Consultants:  None  Procedures:  Chest x-ray 05/27/2015  CT angiogram chest 05/28/2015  Antibiotics:  IV Azithromycin 05/27/2015>>>>> 05/28/2015  IV Levaquin 05/28/2015  HPI/Subjective: Patient states she's feeling better. Shortness of breath improved. Still with cough. Speaking in full sentences. No leg pain.  Objective: Filed Vitals:   05/29/15 0600 05/29/15 0800  BP: 156/89 154/81  Pulse: 105 119  Temp:  98 F (36.7 C)  Resp: 22 23    Intake/Output Summary (Last 24 hours) at 05/29/15 0839 Last data filed at 05/28/15 2000  Gross per 24 hour  Intake    640 ml  Output    700 ml  Net    -60 ml   Filed Weights   05/27/15 2049 05/29/15 0600  Weight: 76.5 kg (168 lb 10.4 oz) 76.204 kg (168 lb)    Exam:   General:  NAD. No use of accessory muscles of respiration, speaking in full sentences.  Cardiovascular: Tachycardia  Respiratory: Fair air movement. Minimal - mild Inspiratory and expiratory wheezing. Coarse BS in upper lobes.  Abdomen: Soft, nontender, nondistended, positive bowel sounds.  Musculoskeletal: No clubbing cyanosis or edema.  Data Reviewed: Basic Metabolic Panel:  Recent Labs Lab 05/27/15 1115 05/27/15 1948 05/28/15 0410 05/29/15 0222  NA 139  --  139 139  K 3.2*  --  4.9 4.1  CL 101  --  104 106  CO2 27  --  24 24  GLUCOSE 117*  --  166* 182*  BUN 18  --  17 23*  CREATININE 0.97 1.12* 0.85 0.93  CALCIUM 9.3  --  9.2 9.0  MG  --   --   --  2.3   Liver Function Tests: No results for  input(s): AST, ALT, ALKPHOS, BILITOT, PROT, ALBUMIN in the last 168 hours. No results for input(s): LIPASE, AMYLASE in the last 168 hours. No results for input(s): AMMONIA in the last 168 hours. CBC:  Recent Labs Lab 05/27/15 1115 05/27/15 1948 05/28/15 0410 05/29/15 0222  WBC 16.1* 14.6* 14.4* 20.3*  NEUTROABS 11.3*  --   --  18.9*  HGB 12.5 12.5 12.5 12.3  HCT 39.8 40.0 38.9 38.8  MCV 88.8 90.1 90.0 91.1  PLT 402* 359 347 337   Cardiac Enzymes: No results for input(s): CKTOTAL, CKMB, CKMBINDEX, TROPONINI in the last 168 hours. BNP (last 3 results) No results for input(s): BNP in the last 8760 hours.  ProBNP (last 3 results) No results for input(s): PROBNP in the last 8760 hours.  CBG:  Recent Labs Lab 05/28/15 0733 05/28/15 1204 05/28/15 1623 05/28/15 2133 05/29/15 0802  GLUCAP 167* 267* 196* 152* 190*    Recent Results (from the past 240 hour(s))  MRSA PCR Screening     Status: None   Collection Time: 05/28/15  8:15 AM  Result Value Ref Range Status   MRSA by PCR NEGATIVE NEGATIVE Final    Comment:        The GeneXpert MRSA Assay (FDA approved for NASAL specimens only), is one component of a comprehensive MRSA colonization surveillance program. It is not intended to diagnose MRSA infection nor to guide or monitor treatment for MRSA infections.   Culture, expectorated sputum-assessment     Status: None   Collection Time: 05/28/15  7:50 PM  Result Value Ref Range Status   Specimen Description SPUTUM  Final   Special Requests NONE  Final   Sputum evaluation   Final    THIS SPECIMEN IS ACCEPTABLE. RESPIRATORY CULTURE REPORT TO FOLLOW.   Report Status 05/28/2015 FINAL  Final     Studies: Dg Chest 2 View  05/27/2015  CLINICAL DATA:  59 year old female with acute shortness of breath and wheezing. EXAM: CHEST  2 VIEW COMPARISON:  06/24/2008 and prior chest radiographs dating back to 04/09/2008. FINDINGS: Cardiomegaly noted. Mild peribronchial thickening  and chronic right middle lobe atelectasis again noted. There is no evidence of focal airspace disease, pulmonary edema, suspicious pulmonary nodule/mass, pleural effusion, or pneumothorax. No acute bony abnormalities are identified. IMPRESSION: Cardiomegaly without evidence of acute cardiopulmonary disease. Mild chronic peribronchial thickening and right middle lobe atelectasis. Electronically Signed   By: Harmon Pier M.D.   On: 05/27/2015 11:43   Ct Angio Chest Pe W/cm &/or Wo Cm  05/28/2015  CLINICAL DATA:  Worsening shortness of breath for last 2 days, history asthma, hypertension, diabetes mellitus EXAM: CT ANGIOGRAPHY CHEST WITH CONTRAST TECHNIQUE: Multidetector CT imaging of the chest was performed using the standard protocol during bolus administration of intravenous contrast. Multiplanar CT image reconstructions and MIPs were obtained to evaluate the vascular anatomy. CONTRAST:  80mL OMNIPAQUE IOHEXOL 350 MG/ML SOLN IV COMPARISON:  05/30/2008 FINDINGS: Aorta normal caliber without aneurysm or dissection. Respiratory motion artifacts limit assessment of the lower lobes. Single questionable filling defect at a LEFT lower lobe pulmonary artery is identified but uncertain if this represents a true filling defect or an artifact from motion. No other definite pulmonary arterial filling defects identified. Visualized portion of upper abdomen unremarkable. No thoracic adenopathy. Within limitations of  respiratory motion, lungs are grossly clear. No pleural effusion or pneumothorax. No acute osseous findings. Review of the MIP images confirms the above findings. IMPRESSION: Multiple artifacts at lower lobe pulmonary arteries secondary to patient motion. Single questionable filling defect versus artifact in LEFT lower lobe pulmonary artery, favor due to motion artifacts which are present at this level though cannot completely exclude a small pulmonary embolus. Consider exclusion of deep venous thrombosis in the  legs by duplex sonography of the lower extremities. If patient has persistent symptoms, may consider either repeat imaging or radionuclide V/Q scan to reassess. Findings called to patient's nurse Teal on 2H on 05/28/2015 at 1644 hours. Electronically Signed   By: Ulyses SouthwardMark  Boles M.D.   On: 05/28/2015 16:47   Dg Chest Port 1 View  05/28/2015  CLINICAL DATA:  Acute respiratory distress secondary to asthma exacerbation, shortness of breath. EXAM: PORTABLE CHEST 1 VIEW COMPARISON:  Chest x-ray dated 05/27/2015. FINDINGS: Heart size is upper normal. Overall cardiomediastinal silhouette is stable in size and configuration. Lungs are clear. Lung volumes are grossly normal. Osseous and soft tissue structures about the chest are unremarkable. IMPRESSION: Lungs are clear and there is no evidence of acute cardiopulmonary abnormality. Electronically Signed   By: Bary RichardStan  Maynard M.D.   On: 05/28/2015 13:06    Scheduled Meds: . arformoterol  15 mcg Nebulization BID  . aspirin EC  81 mg Oral Daily  . atorvastatin  40 mg Oral Daily  . budesonide (PULMICORT) nebulizer solution  0.25 mg Nebulization BID  . clonazePAM  2 mg Oral Daily  . clonazePAM  4 mg Oral QHS  . dextromethorphan-guaiFENesin  2 tablet Oral BID  . diltiazem  120 mg Oral QHS  . DULoxetine  60 mg Oral Daily  . enoxaparin (LOVENOX) injection  40 mg Subcutaneous Q24H  . fluticasone  2 spray Each Nare Daily  . hydrochlorothiazide  25 mg Oral Daily  . Influenza vac split quadrivalent PF  0.5 mL Intramuscular Tomorrow-1000  . insulin aspart  0-15 Units Subcutaneous TID WC  . insulin aspart  0-5 Units Subcutaneous QHS  . insulin aspart  3 Units Subcutaneous TID WC  . insulin detemir  5 Units Subcutaneous QHS  . ipratropium  0.5 mg Nebulization Q4H  . irbesartan  150 mg Oral Daily  . lamoTRIgine  150 mg Oral Daily  . levalbuterol  1.25 mg Nebulization Q4H  . levofloxacin (LEVAQUIN) IV  500 mg Intravenous Q24H  . levothyroxine  50 mcg Oral QAC  breakfast  . loratadine  10 mg Oral Daily  . methylPREDNISolone (SOLU-MEDROL) injection  125 mg Intravenous 4 times per day  . pantoprazole  40 mg Oral Daily  . potassium citrate  10 mEq Oral BID WC  . roflumilast  500 mcg Oral Daily   Continuous Infusions: . albuterol      Principal Problem:   Acute respiratory failure (HCC) Active Problems:   Asthma with acute exacerbation in adult   Asthma with acute exacerbation   Controlled diabetes mellitus (HCC)   Hypothyroidism   Essential hypertension   Depression   Dyspnea   Hypoxia   SOB (shortness of breath)    Time spent: 945 MINS    [facility] CampusHOMPSON,DANIEL MD [facility] Pager [phone]. If 7PM-7AM, please contact night-coverage at [email or web address], password [facility] 05/29/2015, 8:39 AM  LOS: 2 days

## 2015-05-30 DIAGNOSIS — E119 Type 2 diabetes mellitus without complications: Secondary | ICD-10-CM | POA: Insufficient documentation

## 2015-05-30 LAB — CBC WITH DIFFERENTIAL/PLATELET
BAND NEUTROPHILS: 3 %
BASOS ABS: 0 10*3/uL (ref 0.0–0.1)
BLASTS: 0 %
Basophils Relative: 0 %
EOS ABS: 0 10*3/uL (ref 0.0–0.7)
Eosinophils Relative: 0 %
HEMATOCRIT: 36.1 % (ref 36.0–46.0)
HEMOGLOBIN: 11.1 g/dL — AB (ref 12.0–15.0)
Lymphocytes Relative: 4 %
Lymphs Abs: 0.6 10*3/uL — ABNORMAL LOW (ref 0.7–4.0)
MCH: 27.7 pg (ref 26.0–34.0)
MCHC: 30.7 g/dL (ref 30.0–36.0)
MCV: 90 fL (ref 78.0–100.0)
METAMYELOCYTES PCT: 1 %
MYELOCYTES: 1 %
Monocytes Absolute: 0.3 10*3/uL (ref 0.1–1.0)
Monocytes Relative: 2 %
Neutro Abs: 13.9 10*3/uL — ABNORMAL HIGH (ref 1.7–7.7)
Neutrophils Relative %: 89 %
Other: 0 %
PROMYELOCYTES ABS: 0 %
Platelets: 317 10*3/uL (ref 150–400)
RBC: 4.01 MIL/uL (ref 3.87–5.11)
RDW: 14.3 % (ref 11.5–15.5)
WBC: 14.8 10*3/uL — AB (ref 4.0–10.5)
nRBC: 0 /100 WBC

## 2015-05-30 LAB — URINE CULTURE

## 2015-05-30 LAB — GLUCOSE, CAPILLARY
GLUCOSE-CAPILLARY: 144 mg/dL — AB (ref 65–99)
Glucose-Capillary: 180 mg/dL — ABNORMAL HIGH (ref 65–99)
Glucose-Capillary: 174 mg/dL — ABNORMAL HIGH (ref 65–99)
Glucose-Capillary: 173 mg/dL — ABNORMAL HIGH (ref 65–99)
Glucose-Capillary: 197 mg/dL — ABNORMAL HIGH (ref 65–99)

## 2015-05-30 LAB — BASIC METABOLIC PANEL
ANION GAP: 11 (ref 5–15)
BUN: 26 mg/dL — ABNORMAL HIGH (ref 6–20)
CALCIUM: 8.9 mg/dL (ref 8.9–10.3)
CHLORIDE: 105 mmol/L (ref 101–111)
CO2: 24 mmol/L (ref 22–32)
CREATININE: 0.83 mg/dL (ref 0.44–1.00)
GFR calc non Af Amer: >60 mL/min (ref >60)
Glucose, Bld: 190 mg/dL — ABNORMAL HIGH (ref 65–99)
Potassium: 4 mmol/L (ref 3.5–5.1)
SODIUM: 140 mmol/L (ref 135–145)

## 2015-05-30 LAB — MAGNESIUM: MAGNESIUM: 2.1 mg/dL (ref 1.7–2.4)

## 2015-05-30 MED ORDER — LEVOFLOXACIN 500 MG PO TABS
500.0000 mg | ORAL_TABLET | Freq: Every day | ORAL | Status: DC
  Administered 2015-05-30 – 2015-06-02 (×4): 500 mg via ORAL
  Filled 2015-05-30 (×4): qty 1

## 2015-05-30 MED ORDER — METHYLPREDNISOLONE SODIUM SUCC 125 MG IJ SOLR
80.0000 mg | Freq: Three times a day (TID) | INTRAMUSCULAR | Status: DC
  Administered 2015-05-30 – 2015-05-31 (×3): 80 mg via INTRAVENOUS
  Filled 2015-05-30 (×3): qty 2

## 2015-05-30 MED ORDER — LEVALBUTEROL HCL 1.25 MG/0.5ML IN NEBU: 1.2500 mg | INHALATION_SOLUTION | Freq: Four times a day (QID) | RESPIRATORY_TRACT | Status: DC

## 2015-05-30 MED ORDER — LEVALBUTEROL HCL 1.25 MG/0.5ML IN NEBU
1.2500 mg | INHALATION_SOLUTION | Freq: Three times a day (TID) | RESPIRATORY_TRACT | Status: DC
  Administered 2015-05-30 – 2015-06-02 (×8): 1.25 mg via RESPIRATORY_TRACT
  Filled 2015-05-30 (×10): qty 0.5

## 2015-05-30 MED ORDER — IPRATROPIUM BROMIDE 0.02 % IN SOLN
0.5000 mg | Freq: Four times a day (QID) | RESPIRATORY_TRACT | Status: DC
  Administered 2015-05-30: 0.5 mg via RESPIRATORY_TRACT
  Filled 2015-05-30: qty 2.5

## 2015-05-30 MED ORDER — IPRATROPIUM BROMIDE 0.02 % IN SOLN
0.5000 mg | Freq: Three times a day (TID) | RESPIRATORY_TRACT | Status: DC
  Administered 2015-05-30 – 2015-06-02 (×8): 0.5 mg via RESPIRATORY_TRACT
  Filled 2015-05-30 (×9): qty 2.5

## 2015-05-30 NOTE — Care Management Important Message (Signed)
Important Message  Patient Details  Name: [name] MRN: [id number] Date of Birth: 02/26/1956   Medicare Important Message Given:  Yes    Oralia RudMegan P Hiroshi Krummel 05/30/2015, 12:39 PM

## 2015-05-30 NOTE — Progress Notes (Signed)
[facility] PROGRESS NOTE  [name] MWU:[id number] DOB: 1956-01-06 DOA: 05/27/2015 PCP: No primary care provider on file.  Brief interval history [name] is a 59 y.o. female past medical history of asthma was transferred from [facility] for shortness of breath and productive cough that started about 2 days prior to admission she relates her albuterol has not helping her productive cough and her dyspnea as been getting worse. She denies any sick contacts, pets dogs or environment of tobacco. She relates to walk to the bathroom without being short of breath now she can't even walk to the hallway without being short of breath. She relates she's had some fevers but her electronic thermometer is not working. She denies any chills. In the ED: Sound to be mildly hypokalemic, with a mild leukocytosis with left shift chest x-ray showed some peribronchial thickening On 05/28/2015 patient's respiratory status worsened she was in acute respiratory distress secondary to acute asthma exacerbation. Patient was subsequently transferred to the stepdown unit and Pulmicort and Brovana are noted to her regimen. IV steroids were increased to Solu-Medrol 125 every 8 hours. Flonase and Claritin were added as well as IV Levaquin. CT energy gram was also obtained. Lower extremity Dopplers were negative for DVT. Patient improved clinically and is being transferred to telemetry.   Assessment/Plan: #1 acute respiratory distress secondary to acute asthma exacerbation Likely secondary to acute asthma exacerbation. Patient is visiting from [place], had presented to med Center at [facility] with a 2 day history of worsening shortness of breath and productive cough with no improvement on her albuterol. 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 Patient admitted chest x-ray done on admission was negative for any acute infiltrate, however did show mild chronic peribronchial thickening  and right middle lobe atelectasis. Patient was placed on Symbicort, DuoNeb nebs, every 12 Solu-Medrol, dairesp, empiric Tamiflu. Influenza panel ordered. Patient with worsening symptoms with no significant improvement on 05/28/2015. Patient was subsequently transferred to the stepdown unit. Influenza panel was negative. Repeat chest x-ray done was negative for any acute infiltrate. Sputum Gram stain and culture pending. CT angiogram chest with some motion artifact however likely negative for PE. Lower extremity Dopplers negative. Patient with clinical improvement. Change IV Solu-Medrol 80 mg IV every 8 hours. Continue Flonase, Claritin, Xopenex and Atrovent nebs, Mucinex, Pulmicort, Brovana. Change IV Levaquin to oral Levaquin tomorrow.  #2 leukocytosis Patient states she is on chronic steroids. Likely steroid induced. Repeat chest x-ray as chest x-ray on admission negative for any acute infiltrate. Urinalysis is nitrite negative, leukocytes negative. Leukocytosis trending back down and should continue to improve with steroid taper. Change IV Levaquin to oral Levaquin tomorrow.   #3 sinus tachycardia Per nursing patient states she's always been tachycardic. Tachycardia likely a component of nebulizer treatments and acute asthma exacerbation. Patient's TSH was 0.285. Decreased Synthroid dose. Continue home dose Cardizem. Follow.  #4 hypothyroidism TSH is 0.285. Decreased Synthroid to 37.5 mcg daily.  #5 hypertension Continue Cardizem, HCTZ, Avapro.  #6 diabetes mellitus Hemoglobin A1c 7.6. CBGs have ranged from 144-180. Continue Lantus and resistance sliding scale insulin and titrate as needed with CBGs as steroids are being tapered.  #7 depression Stable. Continue Cymbalta, Lamictal, Klonopin.  #8 hypokalemia Repleted.  #9 prophylaxis PPI for GI prophylaxis. Lovenox for DVT prophylaxis.  Code Status: Full Family Communication: Updated patient. No family at bedside. Disposition Plan:  Transfer to telemetry.   Consultants:  None  Procedures:  Chest x-ray 05/27/2015  CT angiogram chest 05/28/2015  Lower extremity Dopplers 05/29/2015  Antibiotics:  IV Azithromycin 05/27/2015>>>>> 05/28/2015  IV Levaquin 05/28/2015  HPI/Subjective: Patient sitting up in chair eating breakfast. States shortness of breath improving. Still with cough. Feeling better.  Objective: Filed Vitals:   05/30/15 0713 05/30/15 0729  BP:  160/87  Pulse: 91 101  Temp:  97.6 F (36.4 C)  Resp: 16 15    Intake/Output Summary (Last 24 hours) at 05/30/15 0847 Last data filed at 05/30/15 0800  Gross per 24 hour  Intake    930 ml  Output   2954 ml  Net  -2024 ml   Filed Weights   05/27/15 2049 05/29/15 0600 05/30/15 0500  Weight: 76.5 kg (168 lb 10.4 oz) 76.204 kg (168 lb) 76 kg (167 lb 8.8 oz)    Exam:   General:  NAD. No use of accessory muscles of respiration, speaking in full sentences.  Cardiovascular: Tachycardia  Respiratory: Fair air movement. Minimal - mild Inspiratory and expiratory wheezing.   Abdomen: Soft, nontender, nondistended, positive bowel sounds.  Musculoskeletal: No clubbing cyanosis or edema.  Data Reviewed: Basic Metabolic Panel:  Recent Labs Lab 05/27/15 1115 05/27/15 1948 05/28/15 0410 05/29/15 0222 05/30/15 0334  NA 139  --  139 139 140  K 3.2*  --  4.9 4.1 4.0  CL 101  --  104 106 105  CO2 27  --  GLUCOSE 117*  --  166* 182* 190*  BUN 18  --  17 23* 26*  CREATININE 0.97 1.12* 0.85 0.93 0.83  CALCIUM 9.3  --  9.2 9.0 8.9  MG  --   --   --  2.3 2.1   Liver Function Tests: No results for input(s): AST, ALT, ALKPHOS, BILITOT, PROT, ALBUMIN in the last 168 hours. No results for input(s): LIPASE, AMYLASE in the last 168 hours. No results for input(s): AMMONIA in the last 168 hours. CBC:  Recent Labs Lab 05/27/15 1115 05/27/15 1948 05/28/15 0410 05/29/15 0222 05/30/15 0334  WBC 16.1* 14.6* 14.4* 20.3* 14.8*   NEUTROABS 11.3*  --   --  18.9* 13.9*  HGB 12.5 12.5 12.5 12.3 11.1*  HCT 39.8 40.0 38.9 38.8 36.1  MCV 88.8 90.1 90.0 91.1 90.0  PLT 402* 359 347 337 317   Cardiac Enzymes: No results for input(s): CKTOTAL, CKMB, CKMBINDEX, TROPONINI in the last 168 hours. BNP (last 3 results) No results for input(s): BNP in the last 8760 hours.  ProBNP (last 3 results) No results for input(s): PROBNP in the last 8760 hours.  CBG:  Recent Labs Lab 05/29/15 0802 05/29/15 1242 05/29/15 1613 05/29/15 2109 05/30/15 0729  GLUCAP 190* 178* 243* 144* 180*    Recent Results (from the past 240 hour(s))  MRSA PCR Screening     Status: None   Collection Time: 05/28/15  8:15 AM  Result Value Ref Range Status   MRSA by PCR NEGATIVE NEGATIVE Final    Comment:        The GeneXpert MRSA Assay (FDA approved for NASAL specimens only), is one component of a comprehensive MRSA colonization surveillance program. It is not intended to diagnose MRSA infection nor to guide or monitor treatment for MRSA infections.   Culture, expectorated sputum-assessment     Status: None   Collection Time: 05/28/15  7:50 PM  Result Value Ref Range Status   Specimen Description SPUTUM  Final   Special Requests NONE  Final   Sputum evaluation   Final  THIS SPECIMEN IS ACCEPTABLE. RESPIRATORY CULTURE REPORT TO FOLLOW.   Report Status 05/28/2015 FINAL  Final  Culture, respiratory (NON-Expectorated)     Status: None (Preliminary result)   Collection Time: 05/28/15  7:50 PM  Result Value Ref Range Status   Specimen Description SPUTUM  Final   Special Requests NONE  Final   Gram Stain   Final    ABUNDANT WBC PRESENT, PREDOMINANTLY PMN FEW SQUAMOUS EPITHELIAL CELLS PRESENT MODERATE GRAM POSITIVE COCCI IN PAIRS FEW GRAM POSITIVE RODS RARE GRAM NEGATIVE RODS    Culture PENDING  Incomplete   Report Status PENDING  Incomplete     Studies: Ct Angio Chest Pe W/cm &/or Wo Cm  05/28/2015  CLINICAL DATA:  Worsening  shortness of breath for last 2 days, history asthma, hypertension, diabetes mellitus EXAM: CT ANGIOGRAPHY CHEST WITH CONTRAST TECHNIQUE: Multidetector CT imaging of the chest was performed using the standard protocol during bolus administration of intravenous contrast. Multiplanar CT image reconstructions and MIPs were obtained to evaluate the vascular anatomy. CONTRAST:  80mL OMNIPAQUE IOHEXOL 350 MG/ML SOLN IV COMPARISON:  05/30/2008 FINDINGS: Aorta normal caliber without aneurysm or dissection. Respiratory motion artifacts limit assessment of the lower lobes. Single questionable filling defect at a LEFT lower lobe pulmonary artery is identified but uncertain if this represents a true filling defect or an artifact from motion. No other definite pulmonary arterial filling defects identified. Visualized portion of upper abdomen unremarkable. No thoracic adenopathy. Within limitations of respiratory motion, lungs are grossly clear. No pleural effusion or pneumothorax. No acute osseous findings. Review of the MIP images confirms the above findings. IMPRESSION: Multiple artifacts at lower lobe pulmonary arteries secondary to patient motion. Single questionable filling defect versus artifact in LEFT lower lobe pulmonary artery, favor due to motion artifacts which are present at this level though cannot completely exclude a small pulmonary embolus. Consider exclusion of deep venous thrombosis in the legs by duplex sonography of the lower extremities. If patient has persistent symptoms, may consider either repeat imaging or radionuclide V/Q scan to reassess. Findings called to patient's nurse Teal on 2H on 05/28/2015 at 1644 hours. Electronically Signed   By: Ulyses SouthwardMark  Boles M.D.   On: 05/28/2015 16:47   Dg Chest Port 1 View  05/28/2015  CLINICAL DATA:  Acute respiratory distress secondary to asthma exacerbation, shortness of breath. EXAM: PORTABLE CHEST 1 VIEW COMPARISON:  Chest x-ray dated 05/27/2015. FINDINGS: Heart  size is upper normal. Overall cardiomediastinal silhouette is stable in size and configuration. Lungs are clear. Lung volumes are grossly normal. Osseous and soft tissue structures about the chest are unremarkable. IMPRESSION: Lungs are clear and there is no evidence of acute cardiopulmonary abnormality. Electronically Signed   By: Bary RichardStan  Maynard M.D.   On: 05/28/2015 13:06    Scheduled Meds: . arformoterol  15 mcg Nebulization BID  . aspirin EC  81 mg Oral Daily  . atorvastatin  40 mg Oral Daily  . budesonide (PULMICORT) nebulizer solution  0.25 mg Nebulization BID  . clonazePAM  2 mg Oral Daily  . clonazePAM  4 mg Oral QHS  . dextromethorphan-guaiFENesin  2 tablet Oral BID  . diltiazem  120 mg Oral Daily  . DULoxetine  60 mg Oral Daily  . enoxaparin (LOVENOX) injection  40 mg Subcutaneous Q24H  . fluticasone  2 spray Each Nare Daily  . hydrochlorothiazide  25 mg Oral Daily  . Influenza vac split quadrivalent PF  0.5 mL Intramuscular Tomorrow-1000  . insulin aspart  0-20 Units Subcutaneous  TID WC  . insulin aspart  0-5 Units Subcutaneous QHS  . insulin aspart  3 Units Subcutaneous TID WC  . insulin detemir  5 Units Subcutaneous QHS  . ipratropium  0.5 mg Nebulization Q6H  . irbesartan  150 mg Oral Daily  . lamoTRIgine  150 mg Oral Daily  . levalbuterol  1.25 mg Nebulization 4 times per day  . levofloxacin (LEVAQUIN) IV  500 mg Intravenous Q24H  . levothyroxine  37.5 mcg Oral QAC breakfast  . loratadine  10 mg Oral Daily  . methylPREDNISolone (SOLU-MEDROL) injection  80 mg Intravenous 3 times per day  . pantoprazole  40 mg Oral Daily  . potassium citrate  10 mEq Oral BID WC  . roflumilast  500 mcg Oral Daily   Continuous Infusions: . albuterol      Principal Problem:   Acute respiratory failure (HCC) Active Problems:   Asthma with acute exacerbation in adult   Asthma with acute exacerbation   Controlled diabetes mellitus (HCC)   Hypothyroidism   Essential hypertension    Depression   Dyspnea   Hypoxia   SOB (shortness of breath)    Time spent: 40 MINS    [facility] MD [facility] Pager [phone]. If 7PM-7AM, please contact night-coverage at [email or web address], password [facility] 05/30/2015, 8:47 AM  LOS: 3 days

## 2015-05-30 NOTE — Progress Notes (Signed)
Received report from [place] on 2 H

## 2015-05-31 DIAGNOSIS — J45901 Unspecified asthma with (acute) exacerbation: Secondary | ICD-10-CM | POA: Diagnosis not present

## 2015-05-31 LAB — GLUCOSE, CAPILLARY
GLUCOSE-CAPILLARY: 189 mg/dL — AB (ref 65–99)
GLUCOSE-CAPILLARY: 233 mg/dL — AB (ref 65–99)
GLUCOSE-CAPILLARY: 97 mg/dL (ref 65–99)
GLUCOSE-CAPILLARY: 224 mg/dL — AB (ref 65–99)

## 2015-05-31 LAB — CULTURE, RESPIRATORY: CULTURE: NORMAL

## 2015-05-31 LAB — BASIC METABOLIC PANEL
Anion gap: 11 (ref 5–15)
BUN: 29 mg/dL — AB (ref 6–20)
CHLORIDE: 100 mmol/L — AB (ref 101–111)
CO2: 29 mmol/L (ref 22–32)
Calcium: 9 mg/dL (ref 8.9–10.3)
Creatinine, Ser: 0.91 mg/dL (ref 0.44–1.00)
GFR calc Af Amer: >60 mL/min (ref >60)
GLUCOSE: 206 mg/dL — AB (ref 65–99)
POTASSIUM: 3.4 mmol/L — AB (ref 3.5–5.1)
Sodium: 140 mmol/L (ref 135–145)

## 2015-05-31 LAB — CBC WITH DIFFERENTIAL/PLATELET
BASOS PCT: 0 %
Basophils Absolute: 0 10*3/uL (ref 0.0–0.1)
EOS ABS: 0 10*3/uL (ref 0.0–0.7)
Eosinophils Relative: 0 %
HCT: 38.5 % (ref 36.0–46.0)
Hemoglobin: 12.3 g/dL (ref 12.0–15.0)
LYMPHS ABS: 1.1 10*3/uL (ref 0.7–4.0)
LYMPHS PCT: 8 %
MCH: 28.5 pg (ref 26.0–34.0)
MCHC: 31.9 g/dL (ref 30.0–36.0)
MCV: 89.1 fL (ref 78.0–100.0)
MONO ABS: 0.8 10*3/uL (ref 0.1–1.0)
Monocytes Relative: 6 %
NEUTROS ABS: 11.4 10*3/uL — AB (ref 1.7–7.7)
Neutrophils Relative %: 86 %
PLATELETS: 305 10*3/uL (ref 150–400)
RBC: 4.32 MIL/uL (ref 3.87–5.11)
RDW: 14.1 % (ref 11.5–15.5)
WBC: 13.3 10*3/uL — ABNORMAL HIGH (ref 4.0–10.5)

## 2015-05-31 LAB — MAGNESIUM: MAGNESIUM: 2.2 mg/dL (ref 1.7–2.4)

## 2015-05-31 LAB — CULTURE, RESPIRATORY W GRAM STAIN

## 2015-05-31 LAB — BRAIN NATRIURETIC PEPTIDE: B Natriuretic Peptide: 75.3 pg/mL (ref 0.0–100.0)

## 2015-05-31 MED ORDER — METHYLPREDNISOLONE SODIUM SUCC 125 MG IJ SOLR
60.0000 mg | Freq: Three times a day (TID) | INTRAMUSCULAR | Status: DC
  Administered 2015-05-31 – 2015-06-01 (×3): 60 mg via INTRAVENOUS
  Filled 2015-05-31 (×4): qty 2

## 2015-05-31 MED ORDER — POTASSIUM CITRATE ER 10 MEQ (1080 MG) PO TBCR
20.0000 meq | EXTENDED_RELEASE_TABLET | Freq: Two times a day (BID) | ORAL | Status: DC
  Administered 2015-05-31 – 2015-06-02 (×4): 20 meq via ORAL
  Filled 2015-05-31 (×8): qty 2

## 2015-05-31 MED ORDER — GUAIFENESIN ER 600 MG PO TB12
1200.0000 mg | ORAL_TABLET | Freq: Two times a day (BID) | ORAL | Status: DC
  Administered 2015-05-31 – 2015-06-02 (×5): 1200 mg via ORAL
  Filled 2015-05-31 (×5): qty 2

## 2015-05-31 NOTE — Progress Notes (Signed)
0745 Pt did not receive dinner tray at 6pm, Dietary was notified 3 times. Informed patient the dinner tray was sent to the wrong room since she was transferred to a different unit and the dinner tray is on the way to the room. Pt refused dinner tray, stated "I will leave AMA if they bring the tray in the room, I have been waiting 3 hours for the dinner tray, I will speak with the manager in the morning." Pt was offered [employer] and healthy choice meal; Pt refused and only accepted pudding and 2 packs of graham crackers

## 2015-05-31 NOTE — Progress Notes (Signed)
PATIENT DETAILS Name: [name] Age: 59 y.o. Sex: female Date of Birth: 02-Feb-1956 Admit Date: 05/27/2015 Admitting Physician Esperanza Sheets, MD PCP:No primary care provider on file.  Subjective: Although better-still wheezing.  Assessment/Plan: Principal Problem: Acute hypoxic respiratory failure: Slowly improving with steroids, bronchodilators, levofloxacin. Tapered off oxygen, now on room air.CT angiogram chest negative for PE, lower extremity Dopplers negative for DVT.   Active Problems: Asthma with acute exacerbation:Still wheezing-but moving air well.seems to be slowly improving. Start tapering Solu-Medrol(on chronic steroids) , continue bronchodilators. Check echo and repeat chest x-ray-but volume status looks good. Influenza PCR negative, cultures negative.  Sinus tachycardia: Secondary to acute illness/bronchodilators. CT angiogram negative for pulmonary embolism, on Synthroid with suppressed TSH-dose already adjusted. Supportive care.   Hypokalemia: Continue potassium supplementation, recheck in a.m. sitting in bed-noted in any distress    Hypothyroidism: TSHsuppressed at 0.285-Synthroid dose decreased to 37.5 g (50 g prior to admission). Repeat TSH in 3 months.  Type 2 diabetes: CBGs somewhat elevated-but on steroids-continue SSI and low-dose Lantus/NovoLog. Resume metformin on discharge.A1c 7.6  Hypertension: Controlled-continue with Cardizem, Avapro, HCTZ  Depression/Anxiety:Stable. Continue Cymbalta, Lamictal, Klonopin.  Disposition: Remain inpatient  Antimicrobial agents  See below  Anti-infectives    Start     Dose/Rate Route Frequency Ordered Stop   05/30/15 1000  levofloxacin (LEVAQUIN) tablet 500 mg     500 mg Oral Daily 05/30/15 0858     05/28/15 2200  levofloxacin (LEVAQUIN) IVPB 500 mg  Status:  Discontinued     500 mg 100 mL/hr over 60 Minutes Intravenous Every 24 hours 05/28/15 0805 05/30/15 0858   05/27/15 2200   oseltamivir (TAMIFLU) capsule 75 mg  Status:  Discontinued     75 mg Oral 2 times daily 05/27/15 1859 05/28/15 2327   05/27/15 2000  azithromycin (ZITHROMAX) 500 mg in dextrose 5 % 250 mL IVPB  Status:  Discontinued     500 mg 250 mL/hr over 60 Minutes Intravenous Every 24 hours 05/27/15 1859 05/28/15 0810      DVT Prophylaxis: Prophylactic Lovenox   Code Status: Full code  Family Communication None at bedside  Procedures: None  CONSULTS:  None  Time spent 30 minutes-Greater than 50% of this time was spent in counseling, explanation of diagnosis, planning of further management, and coordination of care.  MEDICATIONS: Scheduled Meds: . arformoterol  15 mcg Nebulization BID  . aspirin EC  81 mg Oral Daily  . atorvastatin  40 mg Oral Daily  . budesonide (PULMICORT) nebulizer solution  0.25 mg Nebulization BID  . clonazePAM  2 mg Oral Daily  . clonazePAM  4 mg Oral QHS  . diltiazem  120 mg Oral Daily  . DULoxetine  60 mg Oral Daily  . enoxaparin (LOVENOX) injection  40 mg Subcutaneous Q24H  . fluticasone  2 spray Each Nare Daily  . guaiFENesin  1,200 mg Oral BID  . hydrochlorothiazide  25 mg Oral Daily  . insulin aspart  0-20 Units Subcutaneous TID WC  . insulin aspart  0-5 Units Subcutaneous QHS  . insulin aspart  3 Units Subcutaneous TID WC  . insulin detemir  5 Units Subcutaneous QHS  . ipratropium  0.5 mg Nebulization TID  . irbesartan  150 mg Oral Daily  . lamoTRIgine  150 mg Oral Daily  . levalbuterol  1.25 mg Nebulization TID  . levofloxacin  500 mg Oral Daily  . levothyroxine  37.5  mcg Oral QAC breakfast  . loratadine  10 mg Oral Daily  . methylPREDNISolone (SOLU-MEDROL) injection  60 mg Intravenous 3 times per day  . pantoprazole  40 mg Oral Daily  . potassium citrate  10 mEq Oral BID WC  . roflumilast  500 mcg Oral Daily   Continuous Infusions: . albuterol     PRN Meds:.acetaminophen **OR** acetaminophen, hydrALAZINE, ipratropium, levalbuterol,  ondansetron **OR** ondansetron (ZOFRAN) IV, polyethylene glycol    PHYSICAL EXAM: Vital signs in last 24 hours: Filed Vitals:   05/30/15 2208 05/31/15 0500 05/31/15 0631 05/31/15 1322  BP: 144/91  160/90 155/87  Pulse: 103  88 94  Temp: 97.6 F (36.4 C)  97.7 F (36.5 C) 97.4 F (36.3 C)  TempSrc: Oral  Oral Oral  Resp: 20  20 19   Height:      Weight:  74.027 kg (163 lb 3.2 oz)    SpO2: 95%  93% 92%    Weight change: -1.973 kg (-4 lb 5.6 oz) Filed Weights   05/29/15 0600 05/30/15 0500 05/31/15 0500  Weight: 76.204 kg (168 lb) 76 kg (167 lb 8.8 oz) 74.027 kg (163 lb 3.2 oz)   Body mass index is 28 kg/(m^2).   Gen Exam: Awake and alert with clear speech.  Not in any distress-sitting in bed. Neck: Supple, No JVD.   Chest: Only air-coarse rhonchi all over  CVS: S1 S2 Regular, no murmurs.  Abdomen: soft, BS +, non tender, non distended.  Extremities: no edema, lower extremities warm to touch. Neurologic: Non Focal.   Skin: No Rash.   Wounds: N/A.    Intake/Output from previous day:  Intake/Output Summary (Last 24 hours) at 05/31/15 1333 Last data filed at 05/31/15 0900  Gross per 24 hour  Intake    120 ml  Output    450 ml  Net   -330 ml     LAB RESULTS: CBC  Recent Labs Lab 05/27/15 1115 05/27/15 1948 05/28/15 0410 05/29/15 0222 05/30/15 0334 05/31/15 0605  WBC 16.1* 14.6* 14.4* 20.3* 14.8* 13.3*  HGB 12.5 12.5 12.5 12.3 11.1* 12.3  HCT 39.8 40.0 38.9 38.8 36.1 38.5  PLT 402* 359 347 337 317 305  MCV 88.8 90.1 90.0 91.1 90.0 89.1  MCH 27.9 28.2 28.9 28.9 27.7 28.5  MCHC 31.4 31.3 32.1 31.7 30.7 31.9  RDW 14.1 13.9 14.2 14.5 14.3 14.1  LYMPHSABS 3.2  --   --  0.7 0.6* 1.1  MONOABS 1.1*  --   --  0.8 0.3 0.8  EOSABS 0.5  --   --  0.0 0.0 0.0  BASOSABS 0.0  --   --  0.0 0.0 0.0    Chemistries   Recent Labs Lab 05/27/15 1115 05/27/15 1948 05/28/15 0410 05/29/15 0222 05/30/15 0334 05/31/15 0605  NA 139  --  139 139 140 140  K 3.2*  --  4.9  4.1 4.0 3.4*  CL 101  --  104 106 105 100*  CO2 27  --  24 24 24 29   GLUCOSE 117*  --  166* 182* 190* 206*  BUN 18  --  17 23* 26* 29*  CREATININE 0.97 1.12* 0.85 0.93 0.83 0.91  CALCIUM 9.3  --  9.2 9.0 8.9 9.0  MG  --   --   --  2.3 2.1 2.2    CBG:  Recent Labs Lab 05/30/15 1124 05/30/15 1652 05/30/15 2215 05/31/15 0757 05/31/15 1157  GLUCAP 174* 173* 197* 189* 233*    GFR Estimated  Creatinine Clearance: 64 mL/min (by C-G formula based on Cr of 0.91).  Coagulation profile No results for input(s): INR, PROTIME in the last 168 hours.  Cardiac Enzymes No results for input(s): CKMB, TROPONINI, MYOGLOBIN in the last 168 hours.  Invalid input(s): CK  Invalid input(s): POCBNP No results for input(s): DDIMER in the last 72 hours. No results for input(s): HGBA1C in the last 72 hours. No results for input(s): CHOL, HDL, LDLCALC, TRIG, CHOLHDL, LDLDIRECT in the last 72 hours.  Recent Labs  05/29/15 0222  TSH 0.285*   No results for input(s): VITAMINB12, FOLATE, FERRITIN, TIBC, IRON, RETICCTPCT in the last 72 hours. No results for input(s): LIPASE, AMYLASE in the last 72 hours.  Urine Studies No results for input(s): UHGB, CRYS in the last 72 hours.  Invalid input(s): UACOL, UAPR, USPG, UPH, UTP, UGL, UKET, UBIL, UNIT, UROB, ULEU, UEPI, UWBC, URBC, UBAC, CAST, UCOM, BILUA  MICROBIOLOGY: Recent Results (from the past 240 hour(s))  MRSA PCR Screening     Status: None   Collection Time: 05/28/15  8:15 AM  Result Value Ref Range Status   MRSA by PCR NEGATIVE NEGATIVE Final    Comment:        The GeneXpert MRSA Assay (FDA approved for NASAL specimens only), is one component of a comprehensive MRSA colonization surveillance program. It is not intended to diagnose MRSA infection nor to guide or monitor treatment for MRSA infections.   Culture, expectorated sputum-assessment     Status: None   Collection Time: 05/28/15  7:50 PM  Result Value Ref Range Status    Specimen Description SPUTUM  Final   Special Requests NONE  Final   Sputum evaluation   Final    THIS SPECIMEN IS ACCEPTABLE. RESPIRATORY CULTURE REPORT TO FOLLOW.   Report Status 05/28/2015 FINAL  Final  Culture, respiratory (NON-Expectorated)     Status: None   Collection Time: 05/28/15  7:50 PM  Result Value Ref Range Status   Specimen Description SPUTUM  Final   Special Requests NONE  Final   Gram Stain   Final    ABUNDANT WBC PRESENT, PREDOMINANTLY PMN FEW SQUAMOUS EPITHELIAL CELLS PRESENT MODERATE GRAM POSITIVE COCCI IN PAIRS FEW GRAM POSITIVE RODS RARE GRAM NEGATIVE RODS    Culture   Final    NORMAL OROPHARYNGEAL FLORA Performed at [facility]    Report Status 05/31/2015 FINAL  Final  Culture, Urine     Status: None   Collection Time: 05/28/15 10:48 PM  Result Value Ref Range Status   Specimen Description URINE, RANDOM  Final   Special Requests NONE  Final   Culture MULTIPLE SPECIES PRESENT, SUGGEST RECOLLECTION  Final   Report Status 05/30/2015 FINAL  Final    RADIOLOGY STUDIES/RESULTS: Dg Chest 2 View  05/27/2015  CLINICAL DATA:  59 year old female with acute shortness of breath and wheezing. EXAM: CHEST  2 VIEW COMPARISON:  06/24/2008 and prior chest radiographs dating back to 04/09/2008. FINDINGS: Cardiomegaly noted. Mild peribronchial thickening and chronic right middle lobe atelectasis again noted. There is no evidence of focal airspace disease, pulmonary edema, suspicious pulmonary nodule/mass, pleural effusion, or pneumothorax. No acute bony abnormalities are identified. IMPRESSION: Cardiomegaly without evidence of acute cardiopulmonary disease. Mild chronic peribronchial thickening and right middle lobe atelectasis. Electronically Signed   By: Harmon Pier M.D.   On: 05/27/2015 11:43   Ct Angio Chest Pe W/cm &/or Wo Cm  05/28/2015  CLINICAL DATA:  Worsening shortness of breath for last 2 days, history asthma,  hypertension, diabetes mellitus EXAM: CT  ANGIOGRAPHY CHEST WITH CONTRAST TECHNIQUE: Multidetector CT imaging of the chest was performed using the standard protocol during bolus administration of intravenous contrast. Multiplanar CT image reconstructions and MIPs were obtained to evaluate the vascular anatomy. CONTRAST:  80mL OMNIPAQUE IOHEXOL 350 MG/ML SOLN IV COMPARISON:  05/30/2008 FINDINGS: Aorta normal caliber without aneurysm or dissection. Respiratory motion artifacts limit assessment of the lower lobes. Single questionable filling defect at a LEFT lower lobe pulmonary artery is identified but uncertain if this represents a true filling defect or an artifact from motion. No other definite pulmonary arterial filling defects identified. Visualized portion of upper abdomen unremarkable. No thoracic adenopathy. Within limitations of respiratory motion, lungs are grossly clear. No pleural effusion or pneumothorax. No acute osseous findings. Review of the MIP images confirms the above findings. IMPRESSION: Multiple artifacts at lower lobe pulmonary arteries secondary to patient motion. Single questionable filling defect versus artifact in LEFT lower lobe pulmonary artery, favor due to motion artifacts which are present at this level though cannot completely exclude a small pulmonary embolus. Consider exclusion of deep venous thrombosis in the legs by duplex sonography of the lower extremities. If patient has persistent symptoms, may consider either repeat imaging or radionuclide V/Q scan to reassess. Findings called to patient's nurse Teal on 2H on 05/28/2015 at 1644 hours. Electronically Signed   By: Ulyses SouthwardMark  Boles M.D.   On: 05/28/2015 16:47   Dg Chest Port 1 View  05/28/2015  CLINICAL DATA:  Acute respiratory distress secondary to asthma exacerbation, shortness of breath. EXAM: PORTABLE CHEST 1 VIEW COMPARISON:  Chest x-ray dated 05/27/2015. FINDINGS: Heart size is upper normal. Overall cardiomediastinal silhouette is stable in size and configuration.  Lungs are clear. Lung volumes are grossly normal. Osseous and soft tissue structures about the chest are unremarkable. IMPRESSION: Lungs are clear and there is no evidence of acute cardiopulmonary abnormality. Electronically Signed   By: Bary RichardStan  Maynard M.D.   On: 05/28/2015 13:06    Jeoffrey MassedGHIMIRE,Tywaun Hiltner, MD  [facility] Pager:[phone]  If 7PM-7AM, please contact night-coverage [email or web address] Password [email or web address] 05/31/2015, 1:33 PM   LOS: 4 days

## 2015-06-01 ENCOUNTER — Inpatient Hospital Stay (HOSPITAL_COMMUNITY): Payer: Medicare Other

## 2015-06-01 DIAGNOSIS — R0600 Dyspnea, unspecified: Secondary | ICD-10-CM

## 2015-06-01 LAB — GLUCOSE, CAPILLARY
GLUCOSE-CAPILLARY: 218 mg/dL — AB (ref 65–99)
GLUCOSE-CAPILLARY: 166 mg/dL — AB (ref 65–99)
Glucose-Capillary: 181 mg/dL — ABNORMAL HIGH (ref 65–99)
Glucose-Capillary: 192 mg/dL — ABNORMAL HIGH (ref 65–99)
Glucose-Capillary: 224 mg/dL — ABNORMAL HIGH (ref 65–99)

## 2015-06-01 MED ORDER — METHYLPREDNISOLONE SODIUM SUCC 40 MG IJ SOLR
40.0000 mg | Freq: Three times a day (TID) | INTRAMUSCULAR | Status: DC
  Administered 2015-06-01 – 2015-06-02 (×3): 40 mg via INTRAVENOUS
  Filled 2015-06-01 (×2): qty 1

## 2015-06-01 NOTE — Progress Notes (Signed)
PATIENT DETAILS Name: [name] Age: 59 y.o. Sex: female Date of Birth: [birth year] Admit Date: 05/27/2015 Admitting Physician Esperanza Sheets, MD PCP:No primary care provider on file.   Brief narrative: 59 year old female with history of steroid dependent bronchial asthma resented to the hospital with acute respiratory distress secondary to exacerbation of underlying asthma. Hospital course was complicated by development of acute hypoxic respiratory failure requiring transfer to stepdown unit. Slowly improving with supportive care.  Subjective: Slowly improving-shortness of breath with wheezing better.  Assessment/Plan: Principal Problem: Acute hypoxic respiratory failure: Slowly improving with steroids, bronchodilators, levofloxacin. Tapered off oxygen, now on room air.CT angiogram chest negative for PE, lower extremity Dopplers negative for DVT.   Active Problems: Asthma with acute exacerbation:Still wheezing-but much improved over the past 24 hours-moving air well.Seems to be slowly improving. Continue tapering Solu-Medrol(on chronic steroids) , continue Levaquin and bronchodilators. Echo  shows preserved systolic function, repeat chest x-ray today without any pulmonary edema. Influenza PCR negative, sputum cultures negative.  Sinus tachycardia: Secondary to acute illness/bronchodilators. CT angiogram negative for pulmonary embolism, on Synthroid with suppressed TSH-dose already adjusted. Supportive care.   Hypokalemia: Continue potassium supplementation, recheck in a.m.  Hypothyroidism: TSHsuppressed at 0.285-Synthroid dose decreased to 37.5 g (50 g prior to admission). Repeat TSH in 3 months.  Type 2 diabetes: CBGs somewhat elevated-but on steroids-continue SSI and low-dose Lantus/NovoLog. Resume metformin on discharge.A1c 7.6  Hypertension: Controlled-continue with Cardizem, Avapro, HCTZ  Depression/Anxiety:Stable. Continue Cymbalta, Lamictal,  Klonopin.  Disposition: Remain inpatient-suspect home on 12/23 if clinical improvement continues   Antimicrobial agents  See below  Anti-infectives    Start     Dose/Rate Route Frequency Ordered Stop   05/30/15 1000  levofloxacin (LEVAQUIN) tablet 500 mg     500 mg Oral Daily 05/30/15 0858     05/28/15 2200  levofloxacin (LEVAQUIN) IVPB 500 mg  Status:  Discontinued     500 mg 100 mL/hr over 60 Minutes Intravenous Every 24 hours 05/28/15 0805 05/30/15 0858   05/27/15 2200  oseltamivir (TAMIFLU) capsule 75 mg  Status:  Discontinued     75 mg Oral 2 times daily 05/27/15 1859 05/28/15 2327   05/27/15 2000  azithromycin (ZITHROMAX) 500 mg in dextrose 5 % 250 mL IVPB  Status:  Discontinued     500 mg 250 mL/hr over 60 Minutes Intravenous Every 24 hours 05/27/15 1859 05/28/15 0810      DVT Prophylaxis: Prophylactic Lovenox   Code Status: Full code  Family Communication None at bedside  Procedures: None  CONSULTS:  None  Time spent 25 minutes-Greater than 50% of this time was spent in counseling, explanation of diagnosis, planning of further management, and coordination of care.  MEDICATIONS: Scheduled Meds: . arformoterol  15 mcg Nebulization BID  . aspirin EC  81 mg Oral Daily  . atorvastatin  40 mg Oral Daily  . budesonide (PULMICORT) nebulizer solution  0.25 mg Nebulization BID  . clonazePAM  2 mg Oral Daily  . clonazePAM  4 mg Oral QHS  . diltiazem  120 mg Oral Daily  . DULoxetine  60 mg Oral Daily  . enoxaparin (LOVENOX) injection  40 mg Subcutaneous Q24H  . fluticasone  2 spray Each Nare Daily  . guaiFENesin  1,200 mg Oral BID  . hydrochlorothiazide  25 mg Oral Daily  . insulin aspart  0-20 Units Subcutaneous TID WC  . insulin aspart  0-5 Units Subcutaneous  QHS  . insulin aspart  3 Units Subcutaneous TID WC  . insulin detemir  5 Units Subcutaneous QHS  . ipratropium  0.5 mg Nebulization TID  . irbesartan  150 mg Oral Daily  . lamoTRIgine  150 mg Oral  Daily  . levalbuterol  1.25 mg Nebulization TID  . levofloxacin  500 mg Oral Daily  . levothyroxine  37.5 mcg Oral QAC breakfast  . loratadine  10 mg Oral Daily  . methylPREDNISolone (SOLU-MEDROL) injection  60 mg Intravenous 3 times per day  . pantoprazole  40 mg Oral Daily  . potassium citrate  20 mEq Oral BID WC  . roflumilast  500 mcg Oral Daily   Continuous Infusions: . albuterol     PRN Meds:.acetaminophen **OR** acetaminophen, hydrALAZINE, ipratropium, levalbuterol, ondansetron **OR** ondansetron (ZOFRAN) IV, polyethylene glycol    PHYSICAL EXAM: Vital signs in last 24 hours: Filed Vitals:   05/31/15 1412 05/31/15 1959 05/31/15 2228 06/01/15 0900  BP:   158/93   Pulse:   92 74  Temp:   97.6 F (36.4 C)   TempSrc:   Oral   Resp:   18 18  Height:      Weight:      SpO2: 93% 94% 95% 96%    Weight change:  Filed Weights   05/29/15 0600 05/30/15 0500 05/31/15 0500  Weight: 76.204 kg (168 lb) 76 kg (167 lb 8.8 oz) 74.027 kg (163 lb 3.2 oz)   Body mass index is 28 kg/(m^2).   Gen Exam: Awake and alert with clear speech.  Not in any distress-sitting in bed. Neck: Supple, No JVD.   Chest:  Moving air well-scattered rhonchi today. CVS: S1 S2 Regular, no murmurs.  Abdomen: soft, BS +, non tender, non distended.  Extremities: no edema, lower extremities warm to touch. Neurologic: Non Focal.   Skin: No Rash.   Wounds: N/A.    Intake/Output from previous day: No intake or output data in the 24 hours ending 06/01/15 1355   LAB RESULTS: CBC  Recent Labs Lab 05/27/15 1115 05/27/15 1948 05/28/15 0410 05/29/15 0222 05/30/15 0334 05/31/15 0605  WBC 16.1* 14.6* 14.4* 20.3* 14.8* 13.3*  HGB 12.5 12.5 12.5 12.3 11.1* 12.3  HCT 39.8 40.0 38.9 38.8 36.1 38.5  PLT 402* 359 347 337 317 305  MCV 88.8 90.1 90.0 91.1 90.0 89.1  MCH 27.9 28.2 28.9 28.9 27.7 28.5  MCHC 31.4 31.3 32.1 31.7 30.7 31.9  RDW 14.1 13.9 14.2 14.5 14.3 14.1  LYMPHSABS 3.2  --   --  0.7 0.6* 1.1   MONOABS 1.1*  --   --  0.8 0.3 0.8  EOSABS 0.5  --   --  0.0 0.0 0.0  BASOSABS 0.0  --   --  0.0 0.0 0.0    Chemistries   Recent Labs Lab 05/27/15 1115 05/27/15 1948 05/28/15 0410 05/29/15 0222 05/30/15 0334 05/31/15 0605  NA 139  --  139 139 140 140  K 3.2*  --  4.9 4.1 4.0 3.4*  CL 101  --  104 106 105 100*  CO2 27  --  GLUCOSE 117*  --  166* 182* 190* 206*  BUN 18  --  17 23* 26* 29*  CREATININE 0.97 1.12* 0.85 0.93 0.83 0.91  CALCIUM 9.3  --  9.2 9.0 8.9 9.0  MG  --   --   --  2.3 2.1 2.2    CBG:  Recent Labs Lab 05/31/15 1157 05/31/15  1744 05/31/15 2226 05/31/15 2246 06/01/15 0828  GLUCAP 233* 97 224* 218* 181*    GFR Estimated Creatinine Clearance: 64 mL/min (by C-G formula based on Cr of 0.91).  Coagulation profile No results for input(s): INR, PROTIME in the last 168 hours.  Cardiac Enzymes No results for input(s): CKMB, TROPONINI, MYOGLOBIN in the last 168 hours.  Invalid input(s): CK  Invalid input(s): POCBNP No results for input(s): DDIMER in the last 72 hours. No results for input(s): HGBA1C in the last 72 hours. No results for input(s): CHOL, HDL, LDLCALC, TRIG, CHOLHDL, LDLDIRECT in the last 72 hours. No results for input(s): TSH, T4TOTAL, T3FREE, THYROIDAB in the last 72 hours.  Invalid input(s): FREET3 No results for input(s): VITAMINB12, FOLATE, FERRITIN, TIBC, IRON, RETICCTPCT in the last 72 hours. No results for input(s): LIPASE, AMYLASE in the last 72 hours.  Urine Studies No results for input(s): UHGB, CRYS in the last 72 hours.  Invalid input(s): UACOL, UAPR, USPG, UPH, UTP, UGL, UKET, UBIL, UNIT, UROB, ULEU, UEPI, UWBC, URBC, UBAC, CAST, UCOM, BILUA  MICROBIOLOGY: Recent Results (from the past 240 hour(s))  MRSA PCR Screening     Status: None   Collection Time: 05/28/15  8:15 AM  Result Value Ref Range Status   MRSA by PCR NEGATIVE NEGATIVE Final    Comment:        The GeneXpert MRSA Assay (FDA approved for  NASAL specimens only), is one component of a comprehensive MRSA colonization surveillance program. It is not intended to diagnose MRSA infection nor to guide or monitor treatment for MRSA infections.   Culture, expectorated sputum-assessment     Status: None   Collection Time: 05/28/15  7:50 PM  Result Value Ref Range Status   Specimen Description SPUTUM  Final   Special Requests NONE  Final   Sputum evaluation   Final    THIS SPECIMEN IS ACCEPTABLE. RESPIRATORY CULTURE REPORT TO FOLLOW.   Report Status 05/28/2015 FINAL  Final  Culture, respiratory (NON-Expectorated)     Status: None   Collection Time: 05/28/15  7:50 PM  Result Value Ref Range Status   Specimen Description SPUTUM  Final   Special Requests NONE  Final   Gram Stain   Final    ABUNDANT WBC PRESENT, PREDOMINANTLY PMN FEW SQUAMOUS EPITHELIAL CELLS PRESENT MODERATE GRAM POSITIVE COCCI IN PAIRS FEW GRAM POSITIVE RODS RARE GRAM NEGATIVE RODS    Culture   Final    NORMAL OROPHARYNGEAL FLORA Performed at [facility]    Report Status 05/31/2015 FINAL  Final  Culture, Urine     Status: None   Collection Time: 05/28/15 10:48 PM  Result Value Ref Range Status   Specimen Description URINE, RANDOM  Final   Special Requests NONE  Final   Culture MULTIPLE SPECIES PRESENT, SUGGEST RECOLLECTION  Final   Report Status 05/30/2015 FINAL  Final    RADIOLOGY STUDIES/RESULTS: Dg Chest 2 View  06/01/2015  CLINICAL DATA:  Shortness of breath, asthma, hypertension, diabetes mellitus EXAM: CHEST  2 VIEW COMPARISON:  05/28/2015 FINDINGS: Upper normal heart size. Mediastinal contours and pulmonary vascularity normal. Bronchitic changes with linear subsegmental atelectasis at LEFT base. Remaining lungs clear. No pulmonary infiltrate, pleural effusion or pneumothorax. Bones demineralized. IMPRESSION: Bronchitic changes with subsegmental atelectasis LEFT base. Electronically Signed   By: Ulyses Southward M.D.   On: 06/01/2015 07:49    Dg Chest 2 View  05/27/2015  CLINICAL DATA:  59 year old female with acute shortness of breath and wheezing. EXAM: CHEST  2 VIEW COMPARISON:  06/24/2008 and prior chest radiographs dating back to 04/09/2008. FINDINGS: Cardiomegaly noted. Mild peribronchial thickening and chronic right middle lobe atelectasis again noted. There is no evidence of focal airspace disease, pulmonary edema, suspicious pulmonary nodule/mass, pleural effusion, or pneumothorax. No acute bony abnormalities are identified. IMPRESSION: Cardiomegaly without evidence of acute cardiopulmonary disease. Mild chronic peribronchial thickening and right middle lobe atelectasis. Electronically Signed   By: Harmon PierJeffrey  Hu M.D.   On: 05/27/2015 11:43   Ct Angio Chest Pe W/cm &/or Wo Cm  05/28/2015  CLINICAL DATA:  Worsening shortness of breath for last 2 days, history asthma, hypertension, diabetes mellitus EXAM: CT ANGIOGRAPHY CHEST WITH CONTRAST TECHNIQUE: Multidetector CT imaging of the chest was performed using the standard protocol during bolus administration of intravenous contrast. Multiplanar CT image reconstructions and MIPs were obtained to evaluate the vascular anatomy. CONTRAST:  80mL OMNIPAQUE IOHEXOL 350 MG/ML SOLN IV COMPARISON:  05/30/2008 FINDINGS: Aorta normal caliber without aneurysm or dissection. Respiratory motion artifacts limit assessment of the lower lobes. Single questionable filling defect at a LEFT lower lobe pulmonary artery is identified but uncertain if this represents a true filling defect or an artifact from motion. No other definite pulmonary arterial filling defects identified. Visualized portion of upper abdomen unremarkable. No thoracic adenopathy. Within limitations of respiratory motion, lungs are grossly clear. No pleural effusion or pneumothorax. No acute osseous findings. Review of the MIP images confirms the above findings. IMPRESSION: Multiple artifacts at lower lobe pulmonary arteries secondary to  patient motion. Single questionable filling defect versus artifact in LEFT lower lobe pulmonary artery, favor due to motion artifacts which are present at this level though cannot completely exclude a small pulmonary embolus. Consider exclusion of deep venous thrombosis in the legs by duplex sonography of the lower extremities. If patient has persistent symptoms, may consider either repeat imaging or radionuclide V/Q scan to reassess. Findings called to patient's nurse Teal on 2H on 05/28/2015 at 1644 hours. Electronically Signed   By: Ulyses SouthwardMark  Boles M.D.   On: 05/28/2015 16:47   Dg Chest Port 1 View  05/28/2015  CLINICAL DATA:  Acute respiratory distress secondary to asthma exacerbation, shortness of breath. EXAM: PORTABLE CHEST 1 VIEW COMPARISON:  Chest x-ray dated 05/27/2015. FINDINGS: Heart size is upper normal. Overall cardiomediastinal silhouette is stable in size and configuration. Lungs are clear. Lung volumes are grossly normal. Osseous and soft tissue structures about the chest are unremarkable. IMPRESSION: Lungs are clear and there is no evidence of acute cardiopulmonary abnormality. Electronically Signed   By: Bary RichardStan  Maynard M.D.   On: 05/28/2015 13:06    Jeoffrey MassedGHIMIRE,Monie Shere, MD  [facility] Pager:[phone]  If 7PM-7AM, please contact night-coverage [email or web address] Password [email or web address] 06/01/2015, 1:55 PM   LOS: 5 days

## 2015-06-01 NOTE — Progress Notes (Signed)
  Echocardiogram 2D Echocardiogram has been performed.  Leta JunglingCooper, Stacye Noori M 06/01/2015, 8:18 AM

## 2015-06-02 DIAGNOSIS — E038 Other specified hypothyroidism: Secondary | ICD-10-CM

## 2015-06-02 DIAGNOSIS — J4521 Mild intermittent asthma with (acute) exacerbation: Secondary | ICD-10-CM

## 2015-06-02 DIAGNOSIS — E0821 Diabetes mellitus due to underlying condition with diabetic nephropathy: Secondary | ICD-10-CM

## 2015-06-02 LAB — BASIC METABOLIC PANEL
Anion gap: 10 (ref 5–15)
BUN: 27 mg/dL — AB (ref 6–20)
CHLORIDE: 101 mmol/L (ref 101–111)
CO2: 29 mmol/L (ref 22–32)
CREATININE: 0.85 mg/dL (ref 0.44–1.00)
Calcium: 9 mg/dL (ref 8.9–10.3)
GFR calc Af Amer: >60 mL/min (ref >60)
GFR calc non Af Amer: >60 mL/min (ref >60)
GLUCOSE: 251 mg/dL — AB (ref 65–99)
POTASSIUM: 3.8 mmol/L (ref 3.5–5.1)
Sodium: 140 mmol/L (ref 135–145)

## 2015-06-02 LAB — GLUCOSE, CAPILLARY: Glucose-Capillary: 188 mg/dL — ABNORMAL HIGH (ref 65–99)

## 2015-06-02 MED ORDER — HYDRALAZINE HCL 50 MG PO TABS: 50.0000 mg | ORAL_TABLET | Freq: Three times a day (TID) | ORAL | Status: DC

## 2015-06-02 MED ORDER — FUROSEMIDE 10 MG/ML IJ SOLN
20.0000 mg | Freq: Once | INTRAMUSCULAR | Status: AC
  Administered 2015-06-02: 20 mg via INTRAVENOUS
  Filled 2015-06-02: qty 2

## 2015-06-02 MED ORDER — ALBUTEROL SULFATE HFA 108 (90 BASE) MCG/ACT IN AERS: 2.0000 | INHALATION_SPRAY | Freq: Four times a day (QID) | RESPIRATORY_TRACT | Status: AC | PRN

## 2015-06-02 MED ORDER — LEVALBUTEROL HCL 0.63 MG/3ML IN NEBU: 0.6300 mg | INHALATION_SOLUTION | RESPIRATORY_TRACT | Status: AC | PRN

## 2015-06-02 MED ORDER — PREDNISONE 50 MG PO TABS
50.0000 mg | ORAL_TABLET | Freq: Every day | ORAL | Status: DC
  Administered 2015-06-02: 50 mg via ORAL
  Filled 2015-06-02: qty 1

## 2015-06-02 MED ORDER — LEVOTHYROXINE SODIUM 75 MCG PO TABS: 37.5000 ug | ORAL_TABLET | Freq: Every day | ORAL | Status: AC

## 2015-06-02 MED ORDER — HYDRALAZINE HCL 50 MG PO TABS: 50.0000 mg | ORAL_TABLET | Freq: Three times a day (TID) | ORAL | Status: AC

## 2015-06-02 MED ORDER — PREDNISONE 5 MG PO TABS: ORAL_TABLET | ORAL | Status: AC

## 2015-06-02 MED ORDER — PREDNISONE 20 MG PO TABS: 20.0000 mg | ORAL_TABLET | Freq: Every day | ORAL | Status: AC

## 2015-06-02 NOTE — Progress Notes (Signed)
Patient discharge teaching given, including activity, diet, follow-up appoints, and medications. Patient verbalized understanding of all discharge instructions. IV access was d/c'd. Vitals are stable. Skin is intact except as charted in most recent assessments. Pt to be escorted out by NT, to be driven home by family. 

## 2015-06-02 NOTE — Discharge Instructions (Signed)
Follow with Primary MD in 7 days  ° °Get CBC, CMP, 2 view Chest X ray checked  by Primary MD next visit.  ° ° °Activity: As tolerated with Full fall precautions use walker/cane & assistance as needed ° ° °Disposition Home  ° ° °Diet: Heart Healthy Low Carb. ° °For Heart failure patients - Check your Weight same time everyday, if you gain over 2 pounds, or you develop in leg swelling, experience more shortness of breath or chest pain, call your Primary MD immediately. Follow Cardiac Low Salt Diet and 1.5 lit/day fluid restriction. ° ° °On your next visit with your primary care physician please Get Medicines reviewed and adjusted. ° ° °Please request your Prim.MD to go over all Hospital Tests and Procedure/Radiological results at the follow up, please get all Hospital records sent to your Prim MD by signing hospital release before you go home. ° ° °If you experience worsening of your admission symptoms, develop shortness of breath, life threatening emergency, suicidal or homicidal thoughts you must seek medical attention immediately by calling 911 or calling your MD immediately  if symptoms less severe. ° °You Must read complete instructions/literature along with all the possible adverse reactions/side effects for all the Medicines you take and that have been prescribed to you. Take any new Medicines after you have completely understood and accpet all the possible adverse reactions/side effects.  ° °Do not drive, operating heavy machinery, perform activities at heights, swimming or participation in water activities or provide baby sitting services if your were admitted for syncope or siezures until you have seen by Primary MD or a Neurologist and advised to do so again. ° °Do not drive when taking Pain medications.  ° ° °Do not take more than prescribed Pain, Sleep and Anxiety Medications ° °Special Instructions: If you have smoked or chewed Tobacco  in the last 2 yrs please stop smoking, stop any regular Alcohol  and  or any Recreational drug use. ° °Wear Seat belts while driving. ° ° °Please note ° °You were cared for by a hospitalist during your hospital stay. If you have any questions about your discharge medications or the care you received while you were in the hospital after you are discharged, you can call the unit and asked to speak with the hospitalist on call if the hospitalist that took care of you is not available. Once you are discharged, your primary care physician will handle any further medical issues. Please note that NO REFILLS for any discharge medications will be authorized once you are discharged, as it is imperative that you return to your primary care physician (or establish a relationship with a primary care physician if you do not have one) for your aftercare needs so that they can reassess your need for medications and monitor your lab values. ° °

## 2015-06-02 NOTE — Discharge Summary (Signed)
[name], is a 60 y.o. female  DOB Apr 27, 1956  MRN [id number].  Admission date:  05/27/2015  Admitting Physician  Esperanza Sheets, MD  Discharge Date:  06/02/2015   Primary MD  No primary care provider on file.  Recommendations for primary care physician for things to follow:   Must follow with outpatient pulmonary closely for chronic persistent asthma, chronically on steroids. Check TSH in 3 weeks.   Admission Diagnosis  Hypoxia [R09.02] Chronic obstructive pulmonary disease, unspecified COPD type (HCC) [J44.9]   Discharge Diagnosis  Hypoxia [R09.02] Chronic obstructive pulmonary disease, unspecified COPD type (HCC) [J44.9]     Principal Problem:   Acute respiratory failure (HCC) Active Problems:   Asthma with acute exacerbation   Controlled diabetes mellitus (HCC)   Hypothyroidism   Essential hypertension   Depression   Asthma with acute exacerbation in adult   Dyspnea   Hypoxia   SOB (shortness of breath)   Type 2 diabetes, HbA1c goal < 7% (HCC)      Past Medical History  Diagnosis Date  . Asthma   . Hypertension   . Hypothyroid   . Diabetes mellitus without complication (HCC)   . Anxiety   . Depression     Past Surgical History  Procedure Laterality Date  . Abdominal hysterectomy    . Sinus endo w/fusion    . Tonsillectomy    . Dilation and curettage of uterus    . Eye surgery Bilateral     CATARACT       HPI  from the history and physical done on the day of admission:    59 year old female with history of steroid dependent bronchial asthma resented to the hospital with acute respiratory distress secondary to exacerbation of underlying asthma. Hospital course was complicated by development of acute hypoxic respiratory failure requiring transfer to stepdown unit. Slowly  improving with supportive care.     Hospital Course:     Acute hypoxic respiratory failure due to acute on chronic asthma exacerbation. She was treated with IV steroids, Levaquin, oxygen along with nebulizer treatments, she is now close to her baseline without any shortness of breath or oxygen demand, she is chronically on 20 mg of prednisone, for now she'll be placed on a prednisone taper once taper comes down to 20 mg daily she will continue her home dose unchanged. She is currently not requiring oxygen. Albuterol hand held inhaler along with Xopenex neb Theora Gianotti treatments provided, she has been requested to follow with her pulmonary physician closely.   History of sinus tachycardia in the past. Currently in sinus, rate below 80, it gets worse due to her respiratory failure and nebulizer treatments along with mild iatrogenic hyperthyroidism, she is on Cardizem which will be continued.   Hypothyroidism. TSH was suppressed, Synthroid dose has been dropped, recheck TSH in 3 weeks.   Essential hypertension. Home medications continued which include Cardizem and ARB, added hydralazine for better control.   DM type II. On Glucophage continue, A1c was 7.6, sugars increased in  the hospital due to IV steroids, request PCP to monitor CBG and A1c closely.   Anxiety and depression. Continue combination of Cymbalta, let rectal and Klonopin as needed.      Discharge Condition: Stable  Follow UP  Follow-up Information    Follow up with Your PCP. Schedule an appointment as soon as possible for a visit in 3 days.      Follow up with SOOD,VINEET, MD. Schedule an appointment as soon as possible for a visit in 1 week.   Specialty:  Pulmonary Disease   Why:  Asthma   Contact information:   [address] [place] [address] [phone]        Consults obtained -  None  Diet and Activity recommendation: See Discharge Instructions below  Discharge Instructions           Discharge  Instructions    Diet - low sodium heart healthy    Complete by:  As directed      Discharge instructions    Complete by:  As directed   Follow with Primary MD in 7 days   Get CBC, CMP, 2 view Chest X ray checked  by Primary MD next visit.    Activity: As tolerated with Full fall precautions use walker/cane & assistance as needed   Disposition Home     Diet:   Heart Healthy Low Carb.  For Heart failure patients - Check your Weight same time everyday, if you gain over 2 pounds, or you develop in leg swelling, experience more shortness of breath or chest pain, call your Primary MD immediately. Follow Cardiac Low Salt Diet and 1.5 lit/day fluid restriction.   On your next visit with your primary care physician please Get Medicines reviewed and adjusted.   Please request your Prim.MD to go over all Hospital Tests and Procedure/Radiological results at the follow up, please get all Hospital records sent to your Prim MD by signing hospital release before you go home.   If you experience worsening of your admission symptoms, develop shortness of breath, life threatening emergency, suicidal or homicidal thoughts you must seek medical attention immediately by calling 911 or calling your MD immediately  if symptoms less severe.  You Must read complete instructions/literature along with all the possible adverse reactions/side effects for all the Medicines you take and that have been prescribed to you. Take any new Medicines after you have completely understood and accpet all the possible adverse reactions/side effects.   Do not drive, operating heavy machinery, perform activities at heights, swimming or participation in water activities or provide baby sitting services if your were admitted for syncope or siezures until you have seen by Primary MD or a Neurologist and advised to do so again.  Do not drive when taking Pain medications.    Do not take more than prescribed Pain, Sleep and Anxiety  Medications  Special Instructions: If you have smoked or chewed Tobacco  in the last 2 yrs please stop smoking, stop any regular Alcohol  and or any Recreational drug use.  Wear Seat belts while driving.   Please note  You were cared for by a hospitalist during your hospital stay. If you have any questions about your discharge medications or the care you received while you were in the hospital after you are discharged, you can call the unit and asked to speak with the hospitalist on call if the hospitalist that took care of you is not available. Once you are discharged, your primary  care physician will handle any further medical issues. Please note that NO REFILLS for any discharge medications will be authorized once you are discharged, as it is imperative that you return to your primary care physician (or establish a relationship with a primary care physician if you do not have one) for your aftercare needs so that they can reassess your need for medications and monitor your lab values.     Increase activity slowly    Complete by:  As directed              Discharge Medications       Medication List    STOP taking these medications        predniSONE 10 MG tablet  Commonly known as:  DELTASONE  Replaced by:  predniSONE 5 MG tablet  You also have another medication with the same name that you need to continue taking as instructed.      TAKE these medications        albuterol 108 (90 BASE) MCG/ACT inhaler  Commonly known as:  PROVENTIL HFA;VENTOLIN HFA  Inhale 2 puffs into the lungs every 6 (six) hours as needed for wheezing or shortness of breath.     aspirin 81 MG tablet  Take 81 mg by mouth daily.     atorvastatin 40 MG tablet  Commonly known as:  LIPITOR  Take 40 mg by mouth daily.     budesonide-formoterol 160-4.5 MCG/ACT inhaler  Commonly known as:  SYMBICORT  Inhale 2 puffs into the lungs 2 (two) times daily.     clonazePAM 2 MG tablet  Commonly known as:   KLONOPIN  Take 2-4 mg by mouth 2 (two) times daily. Take tablet in the morning Take 2 tablets in the evening     diltiazem 120 MG 24 hr capsule  Commonly known as:  CARDIZEM CD  Take 120 mg by mouth at bedtime.     DULoxetine 60 MG capsule  Commonly known as:  CYMBALTA  Take 60 mg by mouth daily.     EPINEPHrine 0.15 MG/0.15ML injection  Commonly known as:  EPIPEN JR  Inject 0.15 mg into the muscle as needed for anaphylaxis.     fluticasone 50 MCG/ACT nasal spray  Commonly known as:  FLONASE  Place 1 spray into both nostrils daily.     hydrALAZINE 50 MG tablet  Commonly known as:  APRESOLINE  Take 1 tablet (50 mg total) by mouth every 8 (eight) hours.     lamoTRIgine 150 MG tablet  Commonly known as:  LAMICTAL  Take 150 mg by mouth daily.     levalbuterol 0.63 MG/3ML nebulizer solution  Commonly known as:  XOPENEX  Take 3 mLs (0.63 mg total) by nebulization every 2 (two) hours as needed for wheezing.     levothyroxine 75 MCG tablet  Commonly known as:  SYNTHROID, LEVOTHROID  Take 0.5 tablets (37.5 mcg total) by mouth daily before breakfast.     metFORMIN 500 MG tablet  Commonly known as:  GLUCOPHAGE  Take 500-1,000 mg by mouth 2 (two) times daily with a meal. Take 2 tablets in the morning Take 1 tablet at night     multivitamin with minerals Tabs tablet  Take 1 tablet by mouth daily.     omeprazole 40 MG capsule  Commonly known as:  PRILOSEC  Take 40 mg by mouth daily.     potassium citrate 10 MEQ (1080 MG) SR tablet  Commonly known as:  UROCIT-K  Take 10  mEq by mouth 2 (two) times daily with a meal.     predniSONE 20 MG tablet  Commonly known as:  DELTASONE  Take 1 tablet (20 mg total) by mouth daily with breakfast.     predniSONE 5 MG tablet  Commonly known as:  DELTASONE  Label  & dispense according to the schedule below. 10 Pills PO for 3 days then, 8 Pills PO for 3 days, 6 Pills PO for 3 days, 4 Pills PO for 3 days, then go back to you 20 mg daily dose as  before, unchanged. Total 95 pills.     roflumilast 500 MCG Tabs tablet  Commonly known as:  DALIRESP  Take 500 mcg by mouth daily.     theophylline 300 MG 12 hr tablet  Commonly known as:  THEODUR  Take 300 mg by mouth 2 (two) times daily.     valsartan-hydrochlorothiazide 160-25 MG tablet  Commonly known as:  DIOVAN-HCT  Take 1 tablet by mouth daily.        Major procedures and Radiology Reports - PLEASE review detailed and final reports for all details, in brief -   TTE  - Left ventricle: The cavity size was normal. Wall thickness was normal. Systolic function was normal. The estimated ejection fraction was in the range of 55% to 60%. Wall motion was normal; there were no regional wall motion abnormalities. Doppler parameters are consistent with abnormal left ventricular relaxation (grade 1 diastolic dysfunction). - Aortic valve: There was trivial regurgitation.  Impressions:  - Normal LV systolic function; grade 1 diastolic dysfunction; trace AI, MR and TR.   Dg Chest 2 View  06/01/2015  CLINICAL DATA:  Shortness of breath, asthma, hypertension, diabetes mellitus EXAM: CHEST  2 VIEW COMPARISON:  05/28/2015 FINDINGS: Upper normal heart size. Mediastinal contours and pulmonary vascularity normal. Bronchitic changes with linear subsegmental atelectasis at LEFT base. Remaining lungs clear. No pulmonary infiltrate, pleural effusion or pneumothorax. Bones demineralized. IMPRESSION: Bronchitic changes with subsegmental atelectasis LEFT base. Electronically Signed   By: Ulyses SouthwardMark  Boles M.D.   On: 06/01/2015 07:49   Dg Chest 2 View  05/27/2015  CLINICAL DATA:  59 year old female with acute shortness of breath and wheezing. EXAM: CHEST  2 VIEW COMPARISON:  06/24/2008 and prior chest radiographs dating back to 04/09/2008. FINDINGS: Cardiomegaly noted. Mild peribronchial thickening and chronic right middle lobe atelectasis again noted. There is no evidence of focal airspace  disease, pulmonary edema, suspicious pulmonary nodule/mass, pleural effusion, or pneumothorax. No acute bony abnormalities are identified. IMPRESSION: Cardiomegaly without evidence of acute cardiopulmonary disease. Mild chronic peribronchial thickening and right middle lobe atelectasis. Electronically Signed   By: Harmon PierJeffrey  Hu M.D.   On: 05/27/2015 11:43   Ct Angio Chest Pe W/cm &/or Wo Cm  05/28/2015  CLINICAL DATA:  Worsening shortness of breath for last 2 days, history asthma, hypertension, diabetes mellitus EXAM: CT ANGIOGRAPHY CHEST WITH CONTRAST TECHNIQUE: Multidetector CT imaging of the chest was performed using the standard protocol during bolus administration of intravenous contrast. Multiplanar CT image reconstructions and MIPs were obtained to evaluate the vascular anatomy. CONTRAST:  80mL OMNIPAQUE IOHEXOL 350 MG/ML SOLN IV COMPARISON:  05/30/2008 FINDINGS: Aorta normal caliber without aneurysm or dissection. Respiratory motion artifacts limit assessment of the lower lobes. Single questionable filling defect at a LEFT lower lobe pulmonary artery is identified but uncertain if this represents a true filling defect or an artifact from motion. No other definite pulmonary arterial filling defects identified. Visualized portion of upper abdomen unremarkable. No  thoracic adenopathy. Within limitations of respiratory motion, lungs are grossly clear. No pleural effusion or pneumothorax. No acute osseous findings. Review of the MIP images confirms the above findings. IMPRESSION: Multiple artifacts at lower lobe pulmonary arteries secondary to patient motion. Single questionable filling defect versus artifact in LEFT lower lobe pulmonary artery, favor due to motion artifacts which are present at this level though cannot completely exclude a small pulmonary embolus. Consider exclusion of deep venous thrombosis in the legs by duplex sonography of the lower extremities. If patient has persistent symptoms, may  consider either repeat imaging or radionuclide V/Q scan to reassess. Findings called to patient's nurse Teal on 2H on 05/28/2015 at 1644 hours. Electronically Signed   By: Ulyses Southward M.D.   On: 05/28/2015 16:47   Dg Chest Port 1 View  05/28/2015  CLINICAL DATA:  Acute respiratory distress secondary to asthma exacerbation, shortness of breath. EXAM: PORTABLE CHEST 1 VIEW COMPARISON:  Chest x-ray dated 05/27/2015. FINDINGS: Heart size is upper normal. Overall cardiomediastinal silhouette is stable in size and configuration. Lungs are clear. Lung volumes are grossly normal. Osseous and soft tissue structures about the chest are unremarkable. IMPRESSION: Lungs are clear and there is no evidence of acute cardiopulmonary abnormality. Electronically Signed   By: Bary Richard M.D.   On: 05/28/2015 13:06   VASCULAR LAB  Bilateral lower extremity venous duplex completed.   Preliminary report: There is no DVT or SVT noted in the bilateral lower extremities.    Micro Results      Recent Results (from the past 240 hour(s))  MRSA PCR Screening     Status: None   Collection Time: 05/28/15  8:15 AM  Result Value Ref Range Status   MRSA by PCR NEGATIVE NEGATIVE Final    Comment:        The GeneXpert MRSA Assay (FDA approved for NASAL specimens only), is one component of a comprehensive MRSA colonization surveillance program. It is not intended to diagnose MRSA infection nor to guide or monitor treatment for MRSA infections.   Culture, expectorated sputum-assessment     Status: None   Collection Time: 05/28/15  7:50 PM  Result Value Ref Range Status   Specimen Description SPUTUM  Final   Special Requests NONE  Final   Sputum evaluation   Final    THIS SPECIMEN IS ACCEPTABLE. RESPIRATORY CULTURE REPORT TO FOLLOW.   Report Status 05/28/2015 FINAL  Final  Culture, respiratory (NON-Expectorated)     Status: None   Collection Time: 05/28/15  7:50 PM  Result Value Ref Range Status   Specimen  Description SPUTUM  Final   Special Requests NONE  Final   Gram Stain   Final    ABUNDANT WBC PRESENT, PREDOMINANTLY PMN FEW SQUAMOUS EPITHELIAL CELLS PRESENT MODERATE GRAM POSITIVE COCCI IN PAIRS FEW GRAM POSITIVE RODS RARE GRAM NEGATIVE RODS    Culture   Final    NORMAL OROPHARYNGEAL FLORA Performed at [facility]    Report Status 05/31/2015 FINAL  Final  Culture, Urine     Status: None   Collection Time: 05/28/15 10:48 PM  Result Value Ref Range Status   Specimen Description URINE, RANDOM  Final   Special Requests NONE  Final   Culture MULTIPLE SPECIES PRESENT, SUGGEST RECOLLECTION  Final   Report Status 05/30/2015 FINAL  Final       Today   Subjective    [name] today has no headache,no chest abdominal pain,no new weakness tingling or numbness, feels much  better wants to go home today.     Objective   Blood pressure 170/96, pulse 86, temperature 97.8 F (36.6 C), temperature source Oral, resp. rate 18, height  (1.626 m), weight 74.027 kg (163 lb 3.2 oz), SpO2 96 %.   Intake/Output Summary (Last 24 hours) at 06/02/15 1029 Last data filed at 06/02/15 0852  Gross per 24 hour  Intake    360 ml  Output      0 ml  Net    360 ml    Exam Awake Alert, Oriented x 3, No new F.N deficits, Normal affect [place].AT,PERRAL Supple Neck,No JVD, No cervical lymphadenopathy appriciated.  Symmetrical Chest wall movement, Good air movement bilaterally, CTAB RRR,No Gallops,Rubs or new Murmurs, No Parasternal Heave +ve B.Sounds, Abd Soft, Non tender, No organomegaly appriciated, No rebound -guarding or rigidity. No Cyanosis, Clubbing or edema, No new Rash or bruise   Data Review   CBC w Diff:  Lab Results  Component Value Date   WBC 13.3* 05/31/2015   HGB 12.3 05/31/2015   HCT 38.5 05/31/2015   PLT 305 05/31/2015   LYMPHOPCT 8 05/31/2015   BANDSPCT 3 05/30/2015   MONOPCT 6 05/31/2015   EOSPCT 0 05/31/2015   BASOPCT 0 05/31/2015    CMP:  Lab  Results  Component Value Date   NA 140 06/02/2015   K 3.8 06/02/2015   CL 101 06/02/2015   CO2 29 06/02/2015   BUN 27* 06/02/2015   CREATININE 0.85 06/02/2015  .   Total Time in preparing paper work, data evaluation and todays exam - 35 minutes  Leroy Sea M.D on 06/02/2015 at 10:29 AM  [facility]   Office  [phone]

## 2015-06-02 NOTE — Care Management Note (Signed)
Case Management Note  Patient Details  Name: Odis LusterKatheleen M Egnor MRN: [id number] Date of Birth: 07/21/1955  Subjective/Objective:      Patient is for dc today, patient will need neb machine. Patient states [facility], can bring up to her,  [facility], Vaughan BastaJermaine will bring up to patient's room.  No other needs.             Action/Plan:   Expected Discharge Date:  05/29/15               Expected Discharge Plan:  Home/Self Care  In-House Referral:     Discharge planning Services  CM Consult  Post Acute Care Choice:  Durable Medical Equipment Choice offered to:  Patient  DME Arranged:  Nebulizer machine DME Agency:  [facility]  [facility]    [facility]:     Status of Service:  Completed, signed off  Medicare Important Message Given:  Yes Date Medicare IM Given:    Medicare IM give by:    Date Additional Medicare IM Given:    Additional Medicare Important Message give by:     If discussed at Long Length of Stay Meetings, dates discussed:    Additional Comments:  Leone Havenaylor, Clarnce Homan [place], RN 06/02/2015, 11:08 AM

## 2015-06-08 ENCOUNTER — Inpatient Hospital Stay: Payer: Medicare Other | Admitting: Internal Medicine
